# Patient Record
Sex: Male | Born: 1960 | Race: White | Hispanic: No | Marital: Married | Smoking: Never smoker
Health system: Southern US, Community
[De-identification: ages and names within clinical notes are randomized; demographics above are authoritative.]

## PROBLEM LIST (undated history)

## (undated) DIAGNOSIS — E119 Type 2 diabetes mellitus without complications: Secondary | ICD-10-CM

## (undated) DIAGNOSIS — I10 Essential (primary) hypertension: Secondary | ICD-10-CM

## (undated) HISTORY — PX: APPENDECTOMY: SHX54

## (undated) HISTORY — PX: OTHER SURGICAL HISTORY: SHX169

---

## 2002-07-15 HISTORY — PX: HERNIA REPAIR: SHX51

## 2003-06-14 ENCOUNTER — Other Ambulatory Visit: Payer: Self-pay

## 2004-10-14 ENCOUNTER — Emergency Department: Payer: Self-pay | Admitting: General Practice

## 2007-03-22 ENCOUNTER — Emergency Department: Payer: Self-pay

## 2007-04-23 ENCOUNTER — Ambulatory Visit: Payer: Self-pay | Admitting: Internal Medicine

## 2007-05-05 ENCOUNTER — Ambulatory Visit: Payer: Self-pay | Admitting: Urology

## 2008-05-23 ENCOUNTER — Ambulatory Visit: Payer: Self-pay | Admitting: Urology

## 2008-08-28 ENCOUNTER — Ambulatory Visit: Payer: Self-pay | Admitting: Internal Medicine

## 2010-09-21 ENCOUNTER — Ambulatory Visit: Payer: Self-pay | Admitting: Internal Medicine

## 2013-03-26 ENCOUNTER — Emergency Department: Payer: Self-pay | Admitting: Emergency Medicine

## 2013-04-03 ENCOUNTER — Emergency Department: Payer: Self-pay | Admitting: Emergency Medicine

## 2014-09-02 DIAGNOSIS — I10 Essential (primary) hypertension: Secondary | ICD-10-CM | POA: Insufficient documentation

## 2014-09-02 DIAGNOSIS — E782 Mixed hyperlipidemia: Secondary | ICD-10-CM | POA: Insufficient documentation

## 2016-10-31 DIAGNOSIS — E119 Type 2 diabetes mellitus without complications: Secondary | ICD-10-CM | POA: Insufficient documentation

## 2016-10-31 DIAGNOSIS — Z794 Long term (current) use of insulin: Secondary | ICD-10-CM

## 2016-11-20 ENCOUNTER — Other Ambulatory Visit
Admission: RE | Admit: 2016-11-20 | Discharge: 2016-11-20 | Disposition: A | Discharge status: Home / Self Care | Payer: 59 | Source: Ambulatory Visit | Attending: Family Medicine | Admitting: Family Medicine

## 2016-11-20 DIAGNOSIS — M25561 Pain in right knee: Secondary | ICD-10-CM | POA: Insufficient documentation

## 2016-11-24 LAB — BODY FLUID CULTURE: CULTURE: NO GROWTH

## 2016-11-25 DIAGNOSIS — M1A00X0 Idiopathic chronic gout, unspecified site, without tophus (tophi): Secondary | ICD-10-CM | POA: Insufficient documentation

## 2016-12-02 ENCOUNTER — Emergency Department: Payer: 59

## 2016-12-02 ENCOUNTER — Encounter: Payer: Self-pay | Admitting: Emergency Medicine

## 2016-12-02 ENCOUNTER — Inpatient Hospital Stay
Admission: EM | Admit: 2016-12-02 | Discharge: 2016-12-04 | DRG: 683 | Disposition: A | Discharge status: Home / Self Care | Payer: 59 | Attending: Internal Medicine | Admitting: Internal Medicine

## 2016-12-02 DIAGNOSIS — Z794 Long term (current) use of insulin: Secondary | ICD-10-CM

## 2016-12-02 DIAGNOSIS — M7121 Synovial cyst of popliteal space [Baker], right knee: Secondary | ICD-10-CM | POA: Diagnosis present

## 2016-12-02 DIAGNOSIS — D72829 Elevated white blood cell count, unspecified: Secondary | ICD-10-CM | POA: Diagnosis present

## 2016-12-02 DIAGNOSIS — Z85841 Personal history of malignant neoplasm of brain: Secondary | ICD-10-CM | POA: Diagnosis not present

## 2016-12-02 DIAGNOSIS — E785 Hyperlipidemia, unspecified: Secondary | ICD-10-CM | POA: Diagnosis present

## 2016-12-02 DIAGNOSIS — N170 Acute kidney failure with tubular necrosis: Principal | ICD-10-CM | POA: Diagnosis present

## 2016-12-02 DIAGNOSIS — E871 Hypo-osmolality and hyponatremia: Secondary | ICD-10-CM | POA: Diagnosis present

## 2016-12-02 DIAGNOSIS — N179 Acute kidney failure, unspecified: Secondary | ICD-10-CM | POA: Diagnosis present

## 2016-12-02 DIAGNOSIS — M25561 Pain in right knee: Secondary | ICD-10-CM

## 2016-12-02 DIAGNOSIS — M25562 Pain in left knee: Secondary | ICD-10-CM | POA: Diagnosis present

## 2016-12-02 DIAGNOSIS — M109 Gout, unspecified: Secondary | ICD-10-CM | POA: Diagnosis present

## 2016-12-02 DIAGNOSIS — M79605 Pain in left leg: Secondary | ICD-10-CM

## 2016-12-02 DIAGNOSIS — E119 Type 2 diabetes mellitus without complications: Secondary | ICD-10-CM | POA: Diagnosis present

## 2016-12-02 DIAGNOSIS — E860 Dehydration: Secondary | ICD-10-CM | POA: Diagnosis present

## 2016-12-02 DIAGNOSIS — M79604 Pain in right leg: Secondary | ICD-10-CM

## 2016-12-02 DIAGNOSIS — R0602 Shortness of breath: Secondary | ICD-10-CM

## 2016-12-02 DIAGNOSIS — Z79899 Other long term (current) drug therapy: Secondary | ICD-10-CM

## 2016-12-02 DIAGNOSIS — I10 Essential (primary) hypertension: Secondary | ICD-10-CM | POA: Diagnosis present

## 2016-12-02 HISTORY — DX: Essential (primary) hypertension: I10

## 2016-12-02 HISTORY — DX: Type 2 diabetes mellitus without complications: E11.9

## 2016-12-02 LAB — URINALYSIS, COMPLETE (UACMP) WITH MICROSCOPIC
BILIRUBIN URINE: NEGATIVE
Bacteria, UA: NONE SEEN
Glucose, UA: NEGATIVE mg/dL
Hgb urine dipstick: NEGATIVE
Ketones, ur: NEGATIVE mg/dL
Leukocytes, UA: NEGATIVE
Nitrite: NEGATIVE
PH: 5 (ref 5.0–8.0)
Protein, ur: NEGATIVE mg/dL
SPECIFIC GRAVITY, URINE: 1.015 (ref 1.005–1.030)

## 2016-12-02 LAB — COMPREHENSIVE METABOLIC PANEL
ALT: 14 U/L — ABNORMAL LOW (ref 17–63)
AST: 18 U/L (ref 15–41)
Albumin: 3.7 g/dL (ref 3.5–5.0)
Alkaline Phosphatase: 69 U/L (ref 38–126)
Anion gap: 13 (ref 5–15)
BILIRUBIN TOTAL: 0.8 mg/dL (ref 0.3–1.2)
BUN: 56 mg/dL — AB (ref 6–20)
CO2: 16 mmol/L — ABNORMAL LOW (ref 22–32)
Calcium: 8.8 mg/dL — ABNORMAL LOW (ref 8.9–10.3)
Chloride: 101 mmol/L (ref 101–111)
Creatinine, Ser: 2.68 mg/dL — ABNORMAL HIGH (ref 0.61–1.24)
GFR, EST AFRICAN AMERICAN: 29 mL/min — AB (ref >60)
GFR, EST NON AFRICAN AMERICAN: 25 mL/min — AB (ref >60)
Glucose, Bld: 264 mg/dL — ABNORMAL HIGH (ref 65–99)
POTASSIUM: 4.7 mmol/L (ref 3.5–5.1)
Sodium: 130 mmol/L — ABNORMAL LOW (ref 135–145)
TOTAL PROTEIN: 7.4 g/dL (ref 6.5–8.1)

## 2016-12-02 LAB — CBC
HCT: 37.9 % — ABNORMAL LOW (ref 40.0–52.0)
Hemoglobin: 12.7 g/dL — ABNORMAL LOW (ref 13.0–18.0)
MCH: 27.5 pg (ref 26.0–34.0)
MCHC: 33.5 g/dL (ref 32.0–36.0)
MCV: 82 fL (ref 80.0–100.0)
PLATELETS: 341 10*3/uL (ref 150–440)
RBC: 4.62 MIL/uL (ref 4.40–5.90)
RDW: 14.6 % — AB (ref 11.5–14.5)
WBC: 16.2 10*3/uL — ABNORMAL HIGH (ref 3.8–10.6)

## 2016-12-02 LAB — TROPONIN I: Troponin I: <0.03 ng/mL (ref <0.03)

## 2016-12-02 LAB — GLUCOSE, CAPILLARY: GLUCOSE-CAPILLARY: 213 mg/dL — AB (ref 65–99)

## 2016-12-02 LAB — BRAIN NATRIURETIC PEPTIDE: B Natriuretic Peptide: 12 pg/mL (ref 0.0–100.0)

## 2016-12-02 MED ORDER — ONDANSETRON HCL 4 MG/2ML IJ SOLN: 4.0000 mg | Freq: Four times a day (QID) | INTRAMUSCULAR | Status: DC | PRN

## 2016-12-02 MED ORDER — SODIUM CHLORIDE 0.9 % IV SOLN
INTRAVENOUS | Status: DC
  Administered 2016-12-02 – 2016-12-04 (×4): via INTRAVENOUS

## 2016-12-02 MED ORDER — SODIUM CHLORIDE 0.9 % IV BOLUS (SEPSIS)
1000.0000 mL | Freq: Once | INTRAVENOUS | Status: AC
  Administered 2016-12-02: 1000 mL via INTRAVENOUS

## 2016-12-02 MED ORDER — INSULIN ASPART 100 UNIT/ML ~~LOC~~ SOLN
0.0000 [IU] | Freq: Three times a day (TID) | SUBCUTANEOUS | Status: DC
  Administered 2016-12-03 – 2016-12-04 (×2): 1 [IU] via SUBCUTANEOUS
  Filled 2016-12-02 (×2): qty 1

## 2016-12-02 MED ORDER — CARVEDILOL 6.25 MG PO TABS
6.2500 mg | ORAL_TABLET | Freq: Two times a day (BID) | ORAL | Status: DC
  Administered 2016-12-02 – 2016-12-04 (×4): 6.25 mg via ORAL
  Filled 2016-12-02 (×4): qty 1

## 2016-12-02 MED ORDER — ROSUVASTATIN CALCIUM 10 MG PO TABS
20.0000 mg | ORAL_TABLET | Freq: Every day | ORAL | Status: DC
  Administered 2016-12-02 – 2016-12-04 (×3): 20 mg via ORAL
  Filled 2016-12-02 (×3): qty 2

## 2016-12-02 MED ORDER — INSULIN GLARGINE 100 UNIT/ML ~~LOC~~ SOLN
40.0000 [IU] | Freq: Every day | SUBCUTANEOUS | Status: DC
  Administered 2016-12-02: 40 [IU] via SUBCUTANEOUS
  Filled 2016-12-02 (×3): qty 0.4

## 2016-12-02 MED ORDER — ACETAMINOPHEN 325 MG PO TABS
650.0000 mg | ORAL_TABLET | Freq: Four times a day (QID) | ORAL | Status: DC | PRN
  Administered 2016-12-04: 650 mg via ORAL
  Filled 2016-12-02: qty 2

## 2016-12-02 MED ORDER — GLIMEPIRIDE 4 MG PO TABS
4.0000 mg | ORAL_TABLET | Freq: Every day | ORAL | Status: DC
  Administered 2016-12-03: 4 mg via ORAL
  Filled 2016-12-02: qty 1

## 2016-12-02 MED ORDER — HEPARIN SODIUM (PORCINE) 5000 UNIT/ML IJ SOLN
5000.0000 [IU] | Freq: Three times a day (TID) | INTRAMUSCULAR | Status: DC
  Administered 2016-12-02 – 2016-12-04 (×6): 5000 [IU] via SUBCUTANEOUS
  Filled 2016-12-02 (×6): qty 1

## 2016-12-02 MED ORDER — ONDANSETRON HCL 4 MG PO TABS: 4.0000 mg | ORAL_TABLET | Freq: Four times a day (QID) | ORAL | Status: DC | PRN

## 2016-12-02 MED ORDER — ACETAMINOPHEN 650 MG RE SUPP: 650.0000 mg | Freq: Four times a day (QID) | RECTAL | Status: DC | PRN

## 2016-12-02 MED ORDER — TRAMADOL HCL 50 MG PO TABS
100.0000 mg | ORAL_TABLET | Freq: Four times a day (QID) | ORAL | Status: DC | PRN
  Administered 2016-12-03 – 2016-12-04 (×3): 100 mg via ORAL
  Filled 2016-12-02 (×3): qty 2

## 2016-12-02 NOTE — ED Notes (Signed)
Patient presents to the ED stating, "I think I'm dehydrated."  Patient is complaining of shortness of breath on exertion and feeling tired and light headed.  Patient states he has recently been on antibiotics and prednisone for bilateral knee pain.  Patient states the knee pain is much better since starting the antibiotics.  Patient's wife states after patient was out of antibiotics for 3 days, the knee pain returned somewhat.  Patient states he feels better after drinking Gatorade or eating watermelon.

## 2016-12-02 NOTE — H&P (Signed)
[place] [facility] [place] NAME: Randy Luna    MR#:  [id number]  DATE OF BIRTH:  20-Jun-1961  DATE OF ADMISSION:  12/02/2016  PRIMARY CARE PHYSICIAN: Katheren Shams   REQUESTING/REFERRING PHYSICIAN: McShane MD  CHIEF COMPLAINT:   Chief Complaint  Patient presents with  . Shortness of Breath    HISTORY OF PRESENT ILLNESS: Randy Luna  is a 56 y.o. male with a known history of Diabetes type 2, essential hypertension who is presenting to the emergency room with complaint of shortness of breath and feeling weak. Patient in the emergency room and noted to have acute renal failure. Patient reports that his shortness of breath is usually with activity. Patient denies any orthopnea or nocturnal dyspnea and denies any swelling of his lower extremity. Denies any chest pains or palpitations. He does take Advil 2-3 times a week he takes 2-3 tablets as needed. But nothing in excess. He otherwise denies any nausea vomiting or diarrhea. PAST MEDICAL HISTORY:   Past Medical History:  Diagnosis Date  . Diabetes mellitus without complication ([place])   . Hypertension     PAST SURGICAL HISTORY:  Past Surgical History:  Procedure Laterality Date  . APPENDECTOMY    . brain tumor removed    . finger amputation repair      SOCIAL HISTORY:  Social History  Substance Use Topics  . Smoking status: Never Smoker  . Smokeless tobacco: Never Used  . Alcohol use No    FAMILY HISTORY:  Family History  Problem Relation Age of Onset  . Deep vein thrombosis Father     DRUG ALLERGIES: No Known Allergies  REVIEW OF SYSTEMS:   CONSTITUTIONAL: No fever, Positive fatigue and weakness.  EYES: No blurred or double vision.  EARS, NOSE, AND THROAT: No tinnitus or ear pain.  RESPIRATORY: No cough, shortness of breath, wheezing or hemoptysis.  CARDIOVASCULAR: No chest pain, orthopnea, edema.  GASTROINTESTINAL: No nausea, vomiting, diarrhea or abdominal pain.   GENITOURINARY: No dysuria, hematuria.  ENDOCRINE: No polyuria, nocturia,  HEMATOLOGY: No anemia, easy bruising or bleeding SKIN: No rash or lesion. MUSCULOSKELETAL: No joint pain or arthritis.   NEUROLOGIC: No tingling, numbness, weakness.  PSYCHIATRY: No anxiety or depression.   MEDICATIONS AT HOME:  Prior to Admission medications   Medication Sig Start Date End Date Taking? Authorizing Provider  allopurinol (ZYLOPRIM) 100 MG tablet Take 3 tablets by mouth daily. 11/25/16  Yes [provider]  carvedilol (COREG) 6.25 MG tablet Take 1 tablet by mouth 2 (two) times daily. 11/21/16  Yes [provider]  doxycycline (VIBRAMYCIN) 100 MG capsule Take 1 capsule by mouth daily. 11/21/16  Yes [provider]  glimepiride (AMARYL) 4 MG tablet Take 1 tablet by mouth daily. 11/21/16  Yes [provider]  LANTUS SOLOSTAR 100 UNIT/ML Solostar Pen Inject 40 Units into the skin daily. 11/21/16  Yes [provider]  lisinopril-hydrochlorothiazide (PRINZIDE,ZESTORETIC) 20-12.5 MG tablet Take 1 tablet by mouth daily. 11/21/16  Yes [provider]  metFORMIN (GLUCOPHAGE) 1000 MG tablet Take 1 tablet by mouth 2 (two) times daily. 11/21/16  Yes [provider]  rosuvastatin (CRESTOR) 20 MG tablet Take 1 tablet by mouth daily. 11/21/16  Yes [provider]  traMADol (ULTRAM) 50 MG tablet Take 2 tablets by mouth every 6 (six) hours as needed. 11/21/16 12/11/16 Yes [provider]      PHYSICAL EXAMINATION:   VITAL SIGNS: Blood pressure 126/85, pulse 87, temperature 98.3 F (36.8  C), temperature source Oral, resp. rate 18, height 5\' 6"  (1.676 m), weight 235 lb (106.6 kg), SpO2 100 %.  GENERAL:  56 y.o.-year-old patient lying in the bed with no acute distress.  EYES: Pupils equal, round, reactive to light and accommodation. No scleral icterus. Extraocular muscles intact.  HEENT: Head atraumatic, normocephalic. Oropharynx and nasopharynx  clear.  NECK:  Supple, no jugular venous distention. No thyroid enlargement, no tenderness.  LUNGS: Normal breath sounds bilaterally, no wheezing, rales,rhonchi or crepitation. No use of accessory muscles of respiration.  CARDIOVASCULAR: S1, S2 normal. No murmurs, rubs, or gallops.  ABDOMEN: Soft, nontender, nondistended. Bowel sounds present. No organomegaly or mass.  EXTREMITIES: No pedal edema, cyanosis, or clubbing.  NEUROLOGIC: Cranial nerves II through XII are intact. Muscle strength 5/5 in all extremities. Sensation intact. Gait not checked.  PSYCHIATRIC: The patient is alert and oriented x 3.  SKIN: No obvious rash, lesion, or ulcer.   LABORATORY PANEL:   CBC  Recent Labs Lab 12/02/16 1427  WBC 16.2*  HGB 12.7*  HCT 37.9*  PLT 341  MCV 82.0  MCH 27.5  MCHC 33.5  RDW 14.6*   ------------------------------------------------------------------------------------------------------------------  Chemistries   Recent Labs Lab 12/02/16 1427  NA 130*  K 4.7  CL 101  CO2 16*  GLUCOSE 264*  BUN 56*  CREATININE 2.68*  CALCIUM 8.8*  AST 18  ALT 14*  ALKPHOS 69  BILITOT 0.8   ------------------------------------------------------------------------------------------------------------------ estimated creatinine clearance is 35.6 mL/min (A) (by C-G formula based on SCr of 2.68 mg/dL (H)). ------------------------------------------------------------------------------------------------------------------ No results for input(s): TSH, T4TOTAL, T3FREE, THYROIDAB in the last 72 hours.  Invalid input(s): FREET3   Coagulation profile No results for input(s): INR, PROTIME in the last 168 hours. ------------------------------------------------------------------------------------------------------------------- No results for input(s): DDIMER in the last 72  hours. -------------------------------------------------------------------------------------------------------------------  Cardiac Enzymes  Recent Labs Lab 12/02/16 1427  TROPONINI <0.03   ------------------------------------------------------------------------------------------------------------------ Invalid input(s): POCBNP  ---------------------------------------------------------------------------------------------------------------  Urinalysis No results found for: COLORURINE, APPEARANCEUR, LABSPEC, PHURINE, GLUCOSEU, HGBUR, BILIRUBINUR, KETONESUR, PROTEINUR, UROBILINOGEN, NITRITE, LEUKOCYTESUR   RADIOLOGY: Dg Chest 2 View  Result Date: 12/02/2016 CLINICAL DATA:  Fatigue in shortness of breath EXAM: CHEST  2 VIEW COMPARISON:  None. FINDINGS: The heart size and mediastinal contours are within normal limits. Both lungs are clear. The visualized skeletal structures are unremarkable. IMPRESSION: No active cardiopulmonary disease. Electronically Signed   By: Donavan Foil M.D.   On: 12/02/2016 15:11    EKG: Orders placed or performed during the hospital encounter of 12/02/16  . EKG 12-Lead  . EKG 12-Lead  . ED EKG within 10 minutes  . ED EKG within 10 minutes    IMPRESSION AND PLAN: Patient is a 56 year old presenting to the emergency room with shortness of breath  1. Acute renal failure Etiology unclear his renal function was normal about a month ago. We'll give IV fluids monitor renal function will order renal ultrasound Obtain nephrology consult Follow-up on urinalysis  2. Shortness of breath Etiology again unclear Will obtain ultrasound of his lower extremity  VQ scan tomorrow morning I will also order echocardiogram of his heart  3. Leukocytosis Urinalysis currently pending  4. Diabetes type 2 Will place him on sliding scale insulin continue his Lantus Check a hemoglobin A1c  5. Hyperlipidemia unspecified continue therapy with Crestor  6. Miscellaneous  heparin for DVT prophylaxis     All the records are reviewed and case discussed with ED provider. Management plans discussed with the patient, family and they are in agreement.  CODE STATUS: Code Status History    This patient does not have a recorded code status. Please follow your organizational policy for patients in this situation.       TOTAL TIME TAKING CARE OF THIS PATIENT: 55 minutes.    Randy Luna M.D on 12/02/2016 at 6:10 PM  Between 7am to 6pm - Pager - [phone]  After 6pm go to [email or web address] - password [email or web address] [facility]  [facility]  Office  [phone]  CC: Primary care physician; Katheren Shams

## 2016-12-02 NOTE — ED Provider Notes (Signed)
[name] Emergency Department Provider Note  ____________________________________________   I have reviewed the triage vital signs and the nursing notes.   HISTORY  Chief Complaint Shortness of Breath    HPI Randy Luna is a 56 y.o. male with a history of gout, remote history of finger amputation repair, remote history of brain tumor resection, presents today complaining of shortness of breath.Patient states the last 4 days she's had no energy and so dyspneic when he walks around he has to stop. He has never had this before. He feels dehydrated. He did take some fluid and felt a little bit better. He denies any nausea vomiting or diarrhea. He has no chest pain or swelling of his legs. No recent travel besides [facility]. He states that he has had no recent airplane travel. He has a family history of DVT in his father. He did have leg swelling he states several weeks ago which she associated with his gout he took antibiotics and get medication and it went away. He recently went to a gout doctor and was given prescription for multiple medications which she has not yet taken. The patient does take nonsteroidals 3 times a day. Advil. He has had no headache no vomiting. He feels non-symptomatic as he lies still but when he walks he gets very tired and winded. He has had no dysuria or urinary frequency, he does not know of any other complaints that he can the list. He is not had any other new medications. Patient's creatinine according to labs that he brought with him was 1.4 about one month ago.     Past Medical History:  Diagnosis Date  . Diabetes mellitus without complication ([place])   . Hypertension     There are no active problems to display for this patient.   Past Surgical History:  Procedure Laterality Date  . APPENDECTOMY    . brain tumor removed    . finger amputation repair      Prior to Admission medications   Not on File     Allergies Patient has no known allergies.  No family history on file.  Social History Social History  Substance Use Topics  . Smoking status: Never Smoker  . Smokeless tobacco: Not on file  . Alcohol use Not on file    Review of Systems Constitutional: No fever/chills Eyes: No visual changes. ENT: No sore throat. No stiff neck no neck pain Cardiovascular: Denies chest pain. Respiratory: Positive shortness of breath. Gastrointestinal:   no vomiting.  No diarrhea.  No constipation. Genitourinary: Negative for dysuria. Musculoskeletal: Negative lower extremity swelling Skin: Negative for rash. Neurological: Negative for severe headaches, focal weakness or numbness. 10-point ROS otherwise negative.  ____________________________________________   PHYSICAL EXAM:  VITAL SIGNS: ED Triage Vitals  Enc Vitals Group     BP 12/02/16 1422 113/64     Pulse Rate 12/02/16 1422 (!) 103     Resp 12/02/16 1422 16     Temp 12/02/16 1422 98.3 F (36.8 C)     Temp Source 12/02/16 1422 Oral     SpO2 12/02/16 1422 98 %     Weight 12/02/16 1422 235 lb (106.6 kg)     Height 12/02/16 1422 5\' 6"  (1.676 m)     Head Circumference --      Peak Flow --      Pain Score 12/02/16 1428 0     Pain Loc --      Pain Edu? --  Excl. in [place]? --     Constitutional: Alert and oriented. Well appearing and in no acute distress. Eyes: Conjunctivae are normal. PERRL. EOMI. Head: Atraumatic. Nose: No congestion/rhinnorhea. Mouth/Throat: Mucous membranes are moist.  Oropharynx non-erythematous. Neck: No stridor.   Nontender with no meningismus Cardiovascular: Normal rate, regular rhythm. Grossly normal heart sounds.  Good peripheral circulation. Respiratory: Normal respiratory effort.  No retractions. Lungs CTAB. Abdominal: Soft and nontender. No distention. No guarding no rebound Back:  There is no focal tenderness or step off.  there is no midline tenderness there are no lesions noted. there is  no CVA tenderness Musculoskeletal: No lower extremity tenderness, no upper extremity tenderness. No joint effusions, no DVT signs strong distal pulses no edema Neurologic:  Normal speech and language. No gross focal neurologic deficits are appreciated.  Skin:  Skin is warm, dry and intact. No rash noted. Psychiatric: Mood and affect are normal. Speech and behavior are normal.  ____________________________________________   LABS (all labs ordered are listed, but only abnormal results are displayed)  Labs Reviewed  CBC - Abnormal; Notable for the following:       Result Value   WBC 16.2 (*)    Hemoglobin 12.7 (*)    HCT 37.9 (*)    RDW 14.6 (*)    All other components within normal limits  COMPREHENSIVE METABOLIC PANEL - Abnormal; Notable for the following:    Sodium 130 (*)    CO2 16 (*)    Glucose, Bld 264 (*)    BUN 56 (*)    Creatinine, Ser 2.68 (*)    Calcium 8.8 (*)    ALT 14 (*)    GFR calc non Af Amer 25 (*)    GFR calc Af Amer 29 (*)    All other components within normal limits  TROPONIN I   ____________________________________________  EKG  I personally interpreted any EKGs ordered by me or triage Normal sinus rhythm at 90 bpm no acute ST elevation or depression, no acute or ischemic ST changes normal axis unremarkable EKG ____________________________________________  RADIOLOGY  I reviewed any imaging ordered by me or triage that were performed during my shift and, if possible, patient and/or family made aware of any abnormal findings. ____________________________________________   PROCEDURES  Procedure(s) performed: None  Procedures  Critical Care performed: None  ____________________________________________   INITIAL IMPRESSION / ASSESSMENT AND PLAN / ED COURSE  Pertinent labs & imaging results that were available during my care of the patient were reviewed by me and considered in my medical decision making (see chart for details).  Patient here  with dyspnea on exertion feeling of being dehydrated. His creatinine is 2.7. This is up from his baseline of 1.4. Could be dehydration although that be somewhat unusual. It wouldn't I don't think fully account for his shortness of breath. I have ordered bilateral Dopplers, have low suspicion for PE in this patient but I cannot do a CT scan given his renal function. We will begin to hydrate him here. Given his dyspnea, and acute renal injury think he would benefit from further observational admission I will discuss with hospitalist.    ____________________________________________   FINAL CLINICAL IMPRESSION(S) / ED DIAGNOSES  Final diagnoses:  Bilateral leg pain      This chart was dictated using voice recognition software.  Despite best efforts to proofread,  errors can occur which can change meaning.      Schuyler Amor, MD 12/02/16 1730

## 2016-12-02 NOTE — ED Triage Notes (Signed)
States general fatigue and SOB x 3 days. Denies fevers. Denies chest pain.

## 2016-12-03 ENCOUNTER — Inpatient Hospital Stay: Payer: 59

## 2016-12-03 ENCOUNTER — Encounter: Payer: Self-pay | Admitting: Radiology

## 2016-12-03 ENCOUNTER — Inpatient Hospital Stay
Admit: 2016-12-03 | Discharge: 2016-12-03 | Disposition: A | Discharge status: Home / Self Care | Payer: 59 | Attending: Internal Medicine | Admitting: Internal Medicine

## 2016-12-03 LAB — BASIC METABOLIC PANEL
ANION GAP: 5 (ref 5–15)
BUN: 48 mg/dL — ABNORMAL HIGH (ref 6–20)
CHLORIDE: 109 mmol/L (ref 101–111)
CO2: 22 mmol/L (ref 22–32)
CREATININE: 1.66 mg/dL — AB (ref 0.61–1.24)
Calcium: 8.3 mg/dL — ABNORMAL LOW (ref 8.9–10.3)
GFR calc Af Amer: 52 mL/min — ABNORMAL LOW (ref >60)
GFR calc non Af Amer: 45 mL/min — ABNORMAL LOW (ref >60)
Glucose, Bld: 125 mg/dL — ABNORMAL HIGH (ref 65–99)
POTASSIUM: 4.3 mmol/L (ref 3.5–5.1)
Sodium: 136 mmol/L (ref 135–145)

## 2016-12-03 LAB — CBC
HEMATOCRIT: 33.8 % — AB (ref 40.0–52.0)
HEMOGLOBIN: 11.1 g/dL — AB (ref 13.0–18.0)
MCH: 27.1 pg (ref 26.0–34.0)
MCHC: 32.7 g/dL (ref 32.0–36.0)
MCV: 82.8 fL (ref 80.0–100.0)
Platelets: 296 10*3/uL (ref 150–440)
RBC: 4.08 MIL/uL — AB (ref 4.40–5.90)
RDW: 14.9 % — ABNORMAL HIGH (ref 11.5–14.5)
WBC: 12 10*3/uL — ABNORMAL HIGH (ref 3.8–10.6)

## 2016-12-03 LAB — URIC ACID: URIC ACID, SERUM: 9.8 mg/dL — AB (ref 4.4–7.6)

## 2016-12-03 LAB — C-REACTIVE PROTEIN: CRP: 5.4 mg/dL — AB (ref <1.0)

## 2016-12-03 LAB — GLUCOSE, CAPILLARY
GLUCOSE-CAPILLARY: 101 mg/dL — AB (ref 65–99)
Glucose-Capillary: 121 mg/dL — ABNORMAL HIGH (ref 65–99)
Glucose-Capillary: 78 mg/dL (ref 65–99)
Glucose-Capillary: 132 mg/dL — ABNORMAL HIGH (ref 65–99)

## 2016-12-03 LAB — SEDIMENTATION RATE: Sed Rate: 53 mm/hr — ABNORMAL HIGH (ref 0–20)

## 2016-12-03 MED ORDER — INSULIN GLARGINE 100 UNIT/ML ~~LOC~~ SOLN
30.0000 [IU] | Freq: Every day | SUBCUTANEOUS | Status: DC
  Administered 2016-12-03: 30 [IU] via SUBCUTANEOUS
  Filled 2016-12-03 (×2): qty 0.3

## 2016-12-03 MED ORDER — TECHNETIUM TC 99M DIETHYLENETRIAME-PENTAACETIC ACID
32.7930 | Freq: Once | INTRAVENOUS | Status: AC | PRN
  Administered 2016-12-03: 32.793 via INTRAVENOUS

## 2016-12-03 MED ORDER — TECHNETIUM TO 99M ALBUMIN AGGREGATED
4.4240 | Freq: Once | INTRAVENOUS | Status: AC | PRN
  Administered 2016-12-03: 4.424 via INTRAVENOUS

## 2016-12-03 NOTE — Consult Note (Signed)
[facility]  CONSULT NOTE    Date: 12/03/2016                  Patient Name:  Randy Luna  MRN: [id number]  DOB: 1961-03-28  Age / Sex: 56 y.o., male         PCP: Katheren Shams                 Service Requesting Consult: Dr. Posey Pronto                 Reason for Consult: Acute renal failure            History of Present Illness: Mr. Randy Luna is a 56 y.o. white male with diabetes mellitus type II, hypertension, gout, hyperlipidemia, history of intracranial tumor, appendectomy, who was admitted to [facility] on 12/02/2016 for SOB (shortness of breath) [R06.02] Bilateral leg pain [M79.604, M79.605] Acute renal failure (ARF) ([place]) [N17.9] AKI (acute kidney injury) ([place]) [N17.9]  Patient went to the beach last week and says he has felt weak since then. He feels better after getting IV fluids. Creatinine on admission of 2.68 and improved to 1.66 today.   Main complaint is knee pain. New diagnosis of right baker's cyst.    Medications: Outpatient medications: Prescriptions Prior to Admission  Medication Sig Dispense Refill Last Dose  . allopurinol (ZYLOPRIM) 100 MG tablet Take 3 tablets by mouth daily.     . carvedilol (COREG) 6.25 MG tablet Take 1 tablet by mouth 2 (two) times daily.   12/02/2016 at 0700  . doxycycline (VIBRAMYCIN) 100 MG capsule Take 1 capsule by mouth daily.   12/02/2016 at 0800  . glimepiride (AMARYL) 4 MG tablet Take 1 tablet by mouth daily.   12/02/2016 at 0700  . LANTUS SOLOSTAR 100 UNIT/ML Solostar Pen Inject 40 Units into the skin daily.   12/01/2016 at Unknown time  . lisinopril-hydrochlorothiazide (PRINZIDE,ZESTORETIC) 20-12.5 MG tablet Take 1 tablet by mouth daily.     . metFORMIN (GLUCOPHAGE) 1000 MG tablet Take 1 tablet by mouth 2 (two) times daily.   12/02/2016 at 0700  . rosuvastatin (CRESTOR) 20 MG tablet Take 1 tablet by mouth daily.   12/01/2016 at Unknown time  . traMADol (ULTRAM) 50 MG tablet Take 2 tablets by mouth every  6 (six) hours as needed.   prn at prn    Current medications: Current Facility-Administered Medications  Medication Dose Route Frequency Provider Last Rate Last Dose  . 0.9 %  sodium chloride infusion   Intravenous Continuous Dustin Flock, MD 100 mL/hr at 12/03/16 0451    . acetaminophen (TYLENOL) tablet 650 mg  650 mg Oral Q6H PRN Dustin Flock, MD       Or  . acetaminophen (TYLENOL) suppository 650 mg  650 mg Rectal Q6H PRN Dustin Flock, MD      . carvedilol (COREG) tablet 6.25 mg  6.25 mg Oral BID Dustin Flock, MD   6.25 mg at 12/03/16 1130  . glimepiride (AMARYL) tablet 4 mg  4 mg Oral Daily Dustin Flock, MD   4 mg at 12/03/16 1130  . heparin injection 5,000 Units  5,000 Units Subcutaneous Q8H Dustin Flock, MD   5,000 Units at 12/03/16 0603  . insulin aspart (novoLOG) injection 0-9 Units  0-9 Units Subcutaneous TID WC Dustin Flock, MD   1 Units at 12/03/16 1243  . insulin glargine (LANTUS) injection 40 Units  40 Units Subcutaneous Daily Dustin Flock, MD   40 Units at  12/02/16 2153  . ondansetron (ZOFRAN) tablet 4 mg  4 mg Oral Q6H PRN Dustin Flock, MD       Or  . ondansetron [facility]) injection 4 mg  4 mg Intravenous Q6H PRN Dustin Flock, MD      . rosuvastatin (CRESTOR) tablet 20 mg  20 mg Oral Daily Dustin Flock, MD   20 mg at 12/03/16 1129  . traMADol (ULTRAM) tablet 100 mg  100 mg Oral Q6H PRN Dustin Flock, MD          Allergies: No Known Allergies    Past Medical History: Past Medical History:  Diagnosis Date  . Diabetes mellitus without complication ([place])   . Hypertension      Past Surgical History: Past Surgical History:  Procedure Laterality Date  . addenoids    . APPENDECTOMY    . brain tumor removed    . finger amputation repair       Family History: Family History  Problem Relation Age of Onset  . Deep vein thrombosis Father   . Diabetes Father   . Diabetes Mother      Social History: Social History   Social  History  . Marital status: Married    Spouse name: N/A  . Number of children: N/A  . Years of education: N/A   Occupational History  . Not on file.   Social History Main Topics  . Smoking status: Never Smoker  . Smokeless tobacco: Never Used  . Alcohol use No  . Drug use: No  . Sexual activity: No   Other Topics Concern  . Not on file   Social History Narrative  . No narrative on file     Review of Systems: ROS  Vital Signs: Blood pressure 118/75, pulse 79, temperature 97.8 F (36.6 C), temperature source Oral, resp. rate 18, height 5\' 6"  (1.676 m), weight 101.8 kg (224 lb 8 oz), SpO2 100 %.  Weight trends: Filed Weights   12/02/16 1422 12/02/16 1919  Weight: 106.6 kg (235 lb) 101.8 kg (224 lb 8 oz)    Physical Exam: General: NAD,   Head: Normocephalic, atraumatic. Moist oral mucosal membranes  Eyes: Anicteric, PERRL  Neck: Supple, trachea midline  Lungs:  Clear to auscultation  Heart: Regular rate and rhythm  Abdomen:  Soft, nontender,   Extremities: no peripheral edema.  Neurologic: Nonfocal, moving all four extremities  Skin: No lesions        Lab results: Basic Metabolic Panel:  Recent Labs Lab 12/02/16 1427 12/03/16 0405  NA 130* 136  K 4.7 4.3  CL 101 109  CO2 16* 22  GLUCOSE 264* 125*  BUN 56* 48*  CREATININE 2.68* 1.66*  CALCIUM 8.8* 8.3*    Liver Function Tests:  Recent Labs Lab 12/02/16 1427  AST 18  ALT 14*  ALKPHOS 69  BILITOT 0.8  PROT 7.4  ALBUMIN 3.7   No results for input(s): LIPASE, AMYLASE in the last 168 hours. No results for input(s): AMMONIA in the last 168 hours.  CBC:  Recent Labs Lab 12/02/16 1427 12/03/16 0405  WBC 16.2* 12.0*  HGB 12.7* 11.1*  HCT 37.9* 33.8*  MCV 82.0 82.8  PLT 341 296    Cardiac Enzymes:  Recent Labs Lab 12/02/16 1427  TROPONINI <0.03    BNP: Invalid input(s): POCBNP  CBG:  Recent Labs Lab 12/02/16 2109 12/03/16 0752 12/03/16 1213  GLUCAP 213* 101* 121*     Microbiology: Results for orders placed or performed during the hospital encounter  of 11/20/16  Body fluid culture     Status: None   Collection Time: 11/20/16 10:20 AM  Result Value Ref Range Status   Specimen Description KNEE  Final   Special Requests NONE  Final   Gram Stain   Final    ABUNDANT WBC PRESENT,BOTH PMN AND MONONUCLEAR NO ORGANISMS SEEN    Culture   Final    NO GROWTH 3 DAYS Performed at [facility], [address] [place], [place] [address]    Report Status 11/24/2016 FINAL  Final    Coagulation Studies: No results for input(s): LABPROT, INR in the last 72 hours.  Urinalysis:  Recent Labs  12/02/16 1728  COLORURINE YELLOW*  LABSPEC 1.015  PHURINE 5.0  GLUCOSEU NEGATIVE  HGBUR NEGATIVE  BILIRUBINUR NEGATIVE  KETONESUR NEGATIVE  PROTEINUR NEGATIVE  NITRITE NEGATIVE  LEUKOCYTESUR NEGATIVE      Imaging: Dg Chest 2 View  Result Date: 12/02/2016 CLINICAL DATA:  Fatigue in shortness of breath EXAM: CHEST  2 VIEW COMPARISON:  None. FINDINGS: The heart size and mediastinal contours are within normal limits. Both lungs are clear. The visualized skeletal structures are unremarkable. IMPRESSION: No active cardiopulmonary disease. Electronically Signed   By: Donavan Foil M.D.   On: 12/02/2016 15:11   US Renal  Result Date: 12/03/2016 CLINICAL DATA:  Acute renal failure. EXAM: RENAL / URINARY TRACT ULTRASOUND COMPLETE COMPARISON:  None. FINDINGS: Right Kidney: Length: 10.2 cm. Echogenicity within normal limits. No mass or hydronephrosis visualized. Left Kidney: Length: 10.1 cm. Echogenicity within normal limits. No mass or hydronephrosis visualized. Bladder: Appears normal for degree of bladder distention. IMPRESSION: Negative for hydronephrosis.  Normal exam. Electronically Signed   By: Inge Rise M.D.   On: 12/03/2016 11:03   US Venous Img Lower Bilateral  Result Date: 12/03/2016 CLINICAL DATA:  Bilateral lower extremity swelling and pain for  1 month. No known injury. EXAM: BILATERAL LOWER EXTREMITY VENOUS DOPPLER ULTRASOUND TECHNIQUE: Gray-scale sonography with graded compression, as well as color Doppler and duplex ultrasound were performed to evaluate the lower extremity deep venous systems from the level of the common femoral vein and including the common femoral, femoral, profunda femoral, popliteal and calf veins including the posterior tibial, peroneal and gastrocnemius veins when visible. The superficial great saphenous vein was also interrogated. Spectral Doppler was utilized to evaluate flow at rest and with distal augmentation maneuvers in the common femoral, femoral and popliteal veins. COMPARISON:  None. FINDINGS: RIGHT LOWER EXTREMITY Common Femoral Vein: No evidence of thrombus. Normal compressibility, respiratory phasicity and response to augmentation. Saphenofemoral Junction: No evidence of thrombus. Normal compressibility and flow on color Doppler imaging. Profunda Femoral Vein: No evidence of thrombus. Normal compressibility and flow on color Doppler imaging. Femoral Vein: No evidence of thrombus. Normal compressibility, respiratory phasicity and response to augmentation. Popliteal Vein: No evidence of thrombus. Normal compressibility, respiratory phasicity and response to augmentation. Calf Veins: No evidence of thrombus. Normal compressibility and flow on color Doppler imaging. Superficial Great Saphenous Vein: No evidence of thrombus. Normal compressibility and flow on color Doppler imaging. Venous Reflux:  None. Other Findings:  None. LEFT LOWER EXTREMITY Common Femoral Vein: No evidence of thrombus. Normal compressibility, respiratory phasicity and response to augmentation. Saphenofemoral Junction: No evidence of thrombus. Normal compressibility and flow on color Doppler imaging. Profunda Femoral Vein: No evidence of thrombus. Normal compressibility and flow on color Doppler imaging. Femoral Vein: No evidence of thrombus. Normal  compressibility, respiratory phasicity and response to augmentation. Popliteal Vein: No  evidence of thrombus. Normal compressibility, respiratory phasicity and response to augmentation. Calf Veins: No evidence of thrombus. Normal compressibility and flow on color Doppler imaging. Superficial Great Saphenous Vein: No evidence of thrombus. Normal compressibility and flow on color Doppler imaging. Venous Reflux:  None. Other Findings: Cystic lesions with septations and debris are seen in the popliteal fossa on both the right and left consistent with Baker's cysts. Cyst on the right measures 7.0 x 2.6 x 5.8 cm and cyst on the left measures 5.9 x 1.8 x 3.9 cm. IMPRESSION: No evidence of DVT within either lower extremity. Large bilateral Baker's cyst. Electronically Signed   By: Inge Rise M.D.   On: 12/03/2016 11:01      Assessment & Plan: Mr. AARIAN GRIFFIE is a 56 y.o. white male with diabetes mellitus type II, hypertension, gout, hyperlipidemia, history of intracranial tumor, appendectomy, who was admitted to [facility] on 12/02/2016 for SOB (shortness of breath) [R06.02] Bilateral leg pain [M79.604, M79.605] Acute renal failure (ARF) ([place]) [N17.9] AKI (acute kidney injury) ([place]) [N17.9]  1. Acute renal failure: baseline creatinine of 1.14 on 10/2016  Acute renal failure secondary to prerenal azotemia. Improving renal function with IV fluids.  Renal ultrasound reviewed with patient and wife.  - Continue IV NS for one more day.   2. Hypertension: blood pressure at goal.  - continue carvedilol - holding lisinopril and hydrochlorothiazide.   3. Gout: recent acute gout flare. Discussed case with Rheumatology, Dr. Meda Coffee - check uric acid - will need to restart allopurinol.  4. Diabetes mellitus type II with renal manifestations: on metformin.  - continue glucose control.      LOS: [place], Shanta Hartner 5/22/20181:03 PM

## 2016-12-03 NOTE — Progress Notes (Signed)
Initial Nutrition Assessment  DOCUMENTATION CODES:   Obesity unspecified  INTERVENTION:  Encouraged ongoing adequate intake of calories and protein with meals to prevent further unintentional weight loss.  No further nutrition intervention warranted as patient's appetite is now back to baseline and he is finishing 100% of meals.  NUTRITION DIAGNOSIS:   Unintentional weight loss related to poor appetite as evidenced by per patient/family report, 4.5 percent weight loss over 1-2 weeks.  GOAL:   Patient will meet greater than or equal to 90% of their needs  MONITOR:   PO intake, Labs, Weight trends, I & O's  REASON FOR ASSESSMENT:   Malnutrition Screening Tool    ASSESSMENT:   56 year old male with PMHx of DM type 2, HTN, gout who presented with shortness of breath and weakness found to have acute renal failure secondary to prerenal azotemia.   Spoke with patient at bedside. He reports that PTA he had a poor appetite for 1.5-2 weeks. During that time he was eating less than usual. He was still eating 2 meals per day but wasn't able to finish his meals. Was craving fruits such as watermelon or cantaloupe. He denies any N/V, abdominal pain, constipation/diarrhea, or difficulty chewing/swallowing. Reports he is feeling much better now and his appetite is improved to baseline. Reports he is now finishing 100% of meals.  UBW 235 lbs. Patient reports he has lost 10.5 lbs (4.5% body weight) over the past 1-2 weeks, which is significant for time frame.  Medications reviewed and include: Novolog sliding scale TID, Lantus 30 units daily, NS @ 100 ml/hr.  Labs reviewed: CBG 101-213, BUN 48, Creatinine 1.66, Uric Acid 9.8.   Nutrition-Focused physical exam completed. Findings are no fat depletion, no muscle depletion, and no edema.   Patient does not meet criteria for malnutrition.  Diet Order:  Diet heart healthy/carb modified Room service appropriate? Yes; Fluid consistency:  Thin  Skin:  Reviewed, no issues  Last BM:  12/02/2016  Height:   Ht Readings from Last 1 Encounters:  12/02/16 5\' 6"  (1.676 m)    Weight:   Wt Readings from Last 1 Encounters:  12/02/16 224 lb 8 oz (101.8 kg)    Ideal Body Weight:  64.5 kg  BMI:  Body mass index is 36.24 kg/m.  Estimated Nutritional Needs:   Kcal:  1980-2340 (MSJ x 1.1-1.3)  Protein:  100-120 grams (1-1.2 grams/kg)  Fluid:  1.9-2.3 L/day (30-35 ml/kg IBW)  EDUCATION NEEDS:   No education needs identified at this time  Willey Blade, MS, RD, LDN Pager: [phone] After Hours Pager: [phone]

## 2016-12-03 NOTE — Progress Notes (Signed)
Inpatient Diabetes Program Recommendations  AACE/ADA: New Consensus Statement on Inpatient Glycemic Control (2015)  Target Ranges:  Prepandial:   less than 140 mg/dL      Peak postprandial:   less than 180 mg/dL (1-2 hours)      Critically ill patients:  140 - 180 mg/dL   Lab Results  Component Value Date   GLUCAP 101 (H) 12/03/2016    Review of Glycemic Control:  Results for Randy Luna, Randy Luna (MRN [id number]) as of 12/03/2016 11:07  Ref. Range 12/02/2016 21:09 12/03/2016 07:52  Glucose-Capillary Latest Ref Range: 65 - 99 mg/dL 213 (H) 101 (H)   Diabetes history: Type 2 diabetes Outpatient Diabetes medications: Lantus 40 units daily, Amaryl 4 mg daily Current orders for Inpatient glycemic control:  Lantus 40 units q HS, Amaryl 4 mg daily, Novolog sensitive tid with meals  Inpatient Diabetes Program Recommendations:    Consider reducing Lantus to 30 units q HS.  Also may consider d/c of Amaryl while patient is in the hospital.    Thanks, Adah Perl, RN, BC-ADM Inpatient Diabetes Coordinator Pager [phone] (8a-5p)

## 2016-12-03 NOTE — Progress Notes (Signed)
[place] [facility] [place] NAME: Randy Luna    MR#:  [id number]  DATE OF BIRTH:  May 05, 1961  SUBJECTIVE:  CHIEF COMPLAINT:   Chief Complaint  Patient presents with  . Shortness of Breath   feels better. No more shortness of breath. REVIEW OF SYSTEMS:  Review of Systems  Constitutional: Negative for chills, fever and malaise/fatigue.  HENT: Negative for congestion.   Eyes: Negative for blurred vision and double vision.  Respiratory: Negative for cough, shortness of breath, wheezing and stridor.   Cardiovascular: Negative for chest pain, palpitations and leg swelling.  Gastrointestinal: Negative for abdominal pain, blood in stool, diarrhea, melena, nausea and vomiting.  Genitourinary: Negative for dysuria, flank pain, frequency and hematuria.  Musculoskeletal: Negative for back pain.  Skin: Negative for itching and rash.  Neurological: Negative for dizziness, focal weakness, loss of consciousness, weakness and headaches.  Psychiatric/Behavioral: Negative for depression. The patient is not nervous/anxious.     DRUG ALLERGIES:  No Known Allergies VITALS:  Blood pressure (!) 101/59, pulse 84, temperature 97.8 F (36.6 C), temperature source Oral, resp. rate 18, height 5\' 6"  (1.676 m), weight 224 lb 8 oz (101.8 kg), SpO2 99 %. PHYSICAL EXAMINATION:  Physical Exam  Constitutional: He is oriented to person, place, and time and well-developed, well-nourished, and in no distress.  HENT:  Head: Normocephalic.  Mouth/Throat: Oropharynx is clear and moist.  Eyes: Conjunctivae and EOM are normal.  Neck: Normal range of motion. Neck supple. No JVD present. No tracheal deviation present.  Cardiovascular: Normal rate, regular rhythm and normal heart sounds.  Exam reveals no gallop.   No murmur heard. Pulmonary/Chest: Effort normal and breath sounds normal. No respiratory distress. He has no wheezes. He has no rales.  Abdominal: Bowel sounds are  normal. He exhibits no distension. There is no tenderness.  Musculoskeletal: Normal range of motion. He exhibits no edema or tenderness.  Neurological: He is alert and oriented to person, place, and time. No cranial nerve deficit.  Skin: No rash noted. No erythema.  Psychiatric: Affect and judgment normal.   LABORATORY PANEL:  Male CBC  Recent Labs Lab 12/03/16 0405  WBC 12.0*  HGB 11.1*  HCT 33.8*  PLT 296   ------------------------------------------------------------------------------------------------------------------ Chemistries   Recent Labs Lab 12/02/16 1427 12/03/16 0405  NA 130* 136  K 4.7 4.3  CL 101 109  CO2 16* 22  GLUCOSE 264* 125*  BUN 56* 48*  CREATININE 2.68* 1.66*  CALCIUM 8.8* 8.3*  AST 18  --   ALT 14*  --   ALKPHOS 69  --   BILITOT 0.8  --    RADIOLOGY:  Dg Chest 2 View  Result Date: 12/02/2016 CLINICAL DATA:  Fatigue in shortness of breath EXAM: CHEST  2 VIEW COMPARISON:  None. FINDINGS: The heart size and mediastinal contours are within normal limits. Both lungs are clear. The visualized skeletal structures are unremarkable. IMPRESSION: No active cardiopulmonary disease. Electronically Signed   By: Donavan Foil M.D.   On: 12/02/2016 15:11   US Renal  Result Date: 12/03/2016 CLINICAL DATA:  Acute renal failure. EXAM: RENAL / URINARY TRACT ULTRASOUND COMPLETE COMPARISON:  None. FINDINGS: Right Kidney: Length: 10.2 cm. Echogenicity within normal limits. No mass or hydronephrosis visualized. Left Kidney: Length: 10.1 cm. Echogenicity within normal limits. No mass or hydronephrosis visualized. Bladder: Appears normal for degree of bladder distention. IMPRESSION: Negative for hydronephrosis.  Normal exam. Electronically Signed   By: Inge Rise M.D.  On: 12/03/2016 11:03   Nm Pulmonary Perf And Vent  Result Date: 12/03/2016 CLINICAL DATA:  Short of breath EXAM: NUCLEAR MEDICINE VENTILATION - PERFUSION LUNG SCAN TECHNIQUE: Ventilation images were  obtained in multiple projections using inhaled aerosol Tc-66m DTPA. Perfusion images were obtained in multiple projections after intravenous injection of Tc-31m MAA. RADIOPHARMACEUTICALS:  32.8 mCi Technetium-53m DTPA aerosol inhalation and 4.4 mCi Technetium-51m MAA IV COMPARISON:  Chest x-ray 12/02/2016 FINDINGS: Ventilation: No focal ventilation defect. Perfusion: No wedge shaped peripheral perfusion defects to suggest acute pulmonary embolism. IMPRESSION: Normal ventilation and perfusion scan. Electronically Signed   By: Franchot Gallo M.D.   On: 12/03/2016 14:02   US Venous Img Lower Bilateral  Result Date: 12/03/2016 CLINICAL DATA:  Bilateral lower extremity swelling and pain for 1 month. No known injury. EXAM: BILATERAL LOWER EXTREMITY VENOUS DOPPLER ULTRASOUND TECHNIQUE: Gray-scale sonography with graded compression, as well as color Doppler and duplex ultrasound were performed to evaluate the lower extremity deep venous systems from the level of the common femoral vein and including the common femoral, femoral, profunda femoral, popliteal and calf veins including the posterior tibial, peroneal and gastrocnemius veins when visible. The superficial great saphenous vein was also interrogated. Spectral Doppler was utilized to evaluate flow at rest and with distal augmentation maneuvers in the common femoral, femoral and popliteal veins. COMPARISON:  None. FINDINGS: RIGHT LOWER EXTREMITY Common Femoral Vein: No evidence of thrombus. Normal compressibility, respiratory phasicity and response to augmentation. Saphenofemoral Junction: No evidence of thrombus. Normal compressibility and flow on color Doppler imaging. Profunda Femoral Vein: No evidence of thrombus. Normal compressibility and flow on color Doppler imaging. Femoral Vein: No evidence of thrombus. Normal compressibility, respiratory phasicity and response to augmentation. Popliteal Vein: No evidence of thrombus. Normal compressibility, respiratory  phasicity and response to augmentation. Calf Veins: No evidence of thrombus. Normal compressibility and flow on color Doppler imaging. Superficial Great Saphenous Vein: No evidence of thrombus. Normal compressibility and flow on color Doppler imaging. Venous Reflux:  None. Other Findings:  None. LEFT LOWER EXTREMITY Common Femoral Vein: No evidence of thrombus. Normal compressibility, respiratory phasicity and response to augmentation. Saphenofemoral Junction: No evidence of thrombus. Normal compressibility and flow on color Doppler imaging. Profunda Femoral Vein: No evidence of thrombus. Normal compressibility and flow on color Doppler imaging. Femoral Vein: No evidence of thrombus. Normal compressibility, respiratory phasicity and response to augmentation. Popliteal Vein: No evidence of thrombus. Normal compressibility, respiratory phasicity and response to augmentation. Calf Veins: No evidence of thrombus. Normal compressibility and flow on color Doppler imaging. Superficial Great Saphenous Vein: No evidence of thrombus. Normal compressibility and flow on color Doppler imaging. Venous Reflux:  None. Other Findings: Cystic lesions with septations and debris are seen in the popliteal fossa on both the right and left consistent with Baker's cysts. Cyst on the right measures 7.0 x 2.6 x 5.8 cm and cyst on the left measures 5.9 x 1.8 x 3.9 cm. IMPRESSION: No evidence of DVT within either lower extremity. Large bilateral Baker's cyst. Electronically Signed   By: Inge Rise M.D.   On: 12/03/2016 11:01   ASSESSMENT AND PLAN:   Patient is a 56 year old presenting to the emergency room with shortness of breath  1. Acute renal failure due to ATN and dehydration. Improving with IV fluids Normal renal ultrasound Follow-up BMP in a.m.   2. Shortness of breath, improved. normal ultrasound of his lower extremity Normal ventilation and perfusion scan.  echocardiogram is pending.  3. Leukocytosis, possible  due to  stress from renal failure and dehydration. Improved.  Urinalysis is normal.  4. Diabetes type 2 On sliding scale insulin, continue his Lantus Follow-up  hemoglobin A1c  5. Hyperlipidemia unspecified continue therapy with Crestor  6. Hyponatremia. Improved with normal saline IV.  Gout: recent acute gout flare. Follow-up Rheumatology, Dr. Meda Coffee as outpatient.  All the records are reviewed and case discussed with Care Management/Social Worker. Management plans discussed with the patient, his wife and they are in agreement.  CODE STATUS: Full Code  TOTAL TIME TAKING CARE OF THIS PATIENT: 33 minutes.   More than 50% of the time was spent in counseling/coordination of care: YES  POSSIBLE D/C IN 2 DAYS, DEPENDING ON CLINICAL CONDITION.   Demetrios Loll M.D on 12/03/2016 at 2:57 PM  Between 7am to 6pm - Pager - [phone]  After 6pm go to [email or web address] - Proofreader  [facility]  Office  [phone]  CC: Primary care physician; Katheren Shams  Note: This dictation was prepared with Dragon dictation along with smaller phrase technology. Any transcriptional errors that result from this process are unintentional.

## 2016-12-04 ENCOUNTER — Inpatient Hospital Stay: Payer: 59

## 2016-12-04 LAB — BASIC METABOLIC PANEL
ANION GAP: 5 (ref 5–15)
BUN: 26 mg/dL — ABNORMAL HIGH (ref 6–20)
CO2: 24 mmol/L (ref 22–32)
Calcium: 8.4 mg/dL — ABNORMAL LOW (ref 8.9–10.3)
Chloride: 106 mmol/L (ref 101–111)
Creatinine, Ser: 1.18 mg/dL (ref 0.61–1.24)
GFR calc Af Amer: >60 mL/min (ref >60)
GFR calc non Af Amer: >60 mL/min (ref >60)
Glucose, Bld: 120 mg/dL — ABNORMAL HIGH (ref 65–99)
POTASSIUM: 4.9 mmol/L (ref 3.5–5.1)
SODIUM: 135 mmol/L (ref 135–145)

## 2016-12-04 LAB — CBC
HEMATOCRIT: 33.6 % — AB (ref 40.0–52.0)
HEMOGLOBIN: 11.2 g/dL — AB (ref 13.0–18.0)
MCH: 27.6 pg (ref 26.0–34.0)
MCHC: 33.4 g/dL (ref 32.0–36.0)
MCV: 82.8 fL (ref 80.0–100.0)
Platelets: 270 10*3/uL (ref 150–440)
RBC: 4.06 MIL/uL — ABNORMAL LOW (ref 4.40–5.90)
RDW: 15.1 % — AB (ref 11.5–14.5)
WBC: 11.6 10*3/uL — AB (ref 3.8–10.6)

## 2016-12-04 LAB — GLUCOSE, CAPILLARY
Glucose-Capillary: 118 mg/dL — ABNORMAL HIGH (ref 65–99)
Glucose-Capillary: 137 mg/dL — ABNORMAL HIGH (ref 65–99)

## 2016-12-04 LAB — HEMOGLOBIN A1C
HEMOGLOBIN A1C: 11.3 % — AB (ref 4.8–5.6)
MEAN PLASMA GLUCOSE: 278 mg/dL

## 2016-12-04 LAB — SYNOVIAL CELL COUNT + DIFF, W/ CRYSTALS
Eosinophils-Synovial: 0 %
LYMPHOCYTES-SYNOVIAL FLD: 6 %
Monocyte-Macrophage-Synovial Fluid: 6 %
Neutrophil, Synovial: 88 %
OTHER CELLS-SYN: 0
WBC, Synovial: 5605 /mm3 — ABNORMAL HIGH (ref 0–200)

## 2016-12-04 MED ORDER — COLCHICINE 0.6 MG PO TABS: 0.6000 mg | ORAL_TABLET | Freq: Every day | ORAL | 0 refills | Status: DC

## 2016-12-04 MED ORDER — ALLOPURINOL 100 MG PO TABS
100.0000 mg | ORAL_TABLET | Freq: Every day | ORAL | Status: DC
  Filled 2016-12-04: qty 1

## 2016-12-04 MED ORDER — COLCHICINE 0.6 MG PO TABS
0.6000 mg | ORAL_TABLET | Freq: Every day | ORAL | Status: DC
  Filled 2016-12-04: qty 1

## 2016-12-04 MED ORDER — ALLOPURINOL 100 MG PO TABS: 100.0000 mg | ORAL_TABLET | Freq: Every day | ORAL | 0 refills | Status: DC

## 2016-12-04 NOTE — Progress Notes (Signed)
Per Dr. Bridgett Larsson okay to place order for PT consult

## 2016-12-04 NOTE — Consult Note (Signed)
Went to see patient.  Dr. Precious Reel already seeing patient.  There is no need for both orthopaedics and rheumatology to see this patient.  It is duplicating services.  Dr. Jefm Bryant with provide consultation services for this patient and may contact orthopaedics if necessary.   I have canceled the ortho consult for this patient.

## 2016-12-04 NOTE — Consult Note (Signed)
Reason for Consult: Bilateral knee pain   Referring Physician: Lenon Ahmadi   HPI: 56 year old white male. On a [employer]. Prior history of gout including prior history of gout in the knee. Used to be on allopurinol many years ago. Has been off of it. No significant family history no significant alcohol. Did have kidney stone in 2008 About a month ago he started having bilateral knee pain.  Then thought his knees were infected and he took doxycycline. No fever. No rash. No bites. Then prednisone taper. Saw orthopedist. At arthrocentesis with elevated white cells. Culture negative. Was given steroid with resolution. Was still on some doxycycline. The motorcycle vacation to the beach. The last few days said he was in the hot sun did not drink a lot. Felt poorly. Can emergency room and he was in acute renal insufficiency. Thought to be dehydration. Improved with IV fluids Since the knees and started paining again. Bilateral. Hurts to touch or to stand on them. Other joints have been quiet. He previously saw rheumatology. Was given colchicine and allopurinol but had not started it. Some shortness of breath and weakness. VQ scan unremarkable. Chest x-ray troponin unremarkable. Ultrasound showed bilateral Baker cysts. X-ray showed mild to [facility] changes  PMH: Diabetes. Glioma brainstem. Hyperlipidemia. Hypertension  SURGICAL HISTORY: Appendectomy. Hernia repair. Glioma surgery.  Family History: Coronary disease hypertension. Diabetes. No history of gout  Social History: No significant alcohol  Allergies: No Known Allergies  Medications: Not on colchicine or allopurinol.     ROS: No chest pain. No psoriasis. No other joints bothering. No fever. No shortness of breath. No blood per rectum. No abdominal pain.   PHYSICAL EXAM: Blood pressure 128/76, pulse 73, temperature 98.3 F (36.8 C), temperature source Oral, resp. rate 20, height 5\' 6"  (1.676 m), weight 101.8 kg (224 lb 8 oz), SpO2  100 %. Skin without psoriasis. Sclera clear. Clear chest. No significant murmur. Nontender abdomen. No visceromegaly. No significant edema Muscular skeletal: Good range of motion neck and shoulders. No elbow or tophi. Hands without synovitis. Hips move well. Bilateral knee effusions. No ankle synovitis MTPs nontender  Assessment: Bilateral knee arthropathy. Presumably crystalline given prior history of gout, hyperuricemia and elevated plantar markers. prior response to steroids Recent acute renal insufficiency, resolved. Creatinine down to 1.1 with IV fluids  Recommendations: PROCEDURE:  Left knee prepped in a sterile fashion Left knee aspirated 25 cc mildly cloudy fluid. Sent for microscopy Injected with 2 cc Xylocaine 2 cc Marcaine and 1 cc Kenalog  PROCEDURE:  Right knee prepped in a sterile fashion 7 cc aspirated mildly cloudy fluid. Was not Sent for microscopy. Injected with 2 cc Xylocaine 2 cc Marcaine and 1 cc Kenalog  Resume colchicine 0.6 g 1 by mouth daily. Allopurinol 100 mg by mouth daily. Follow-up with Dr. Meda Coffee as outpatient  Leeanne Mannan, [place] 12/04/2016, 1:36 PM

## 2016-12-04 NOTE — Progress Notes (Signed)
[facility]  ROUNDING NOTE   Subjective:   WIfe at bedside.  Creatinine back to baseline.   Complains of knee pain. Claims he cannot ambulate.   Objective:  Vital signs in last 24 hours:  Temp:  [98.3 F (36.8 C)-98.4 F (36.9 C)] 98.4 F (36.9 C) (05/23 1358) Pulse Rate:  [71-88] 80 (05/23 1358) Resp:  [16-20] 16 (05/23 1358) BP: (116-128)/(57-85) 122/85 (05/23 1358) SpO2:  [96 %-100 %] 96 % (05/23 1358)  Weight change:  Filed Weights   12/02/16 1422 12/02/16 1919  Weight: 106.6 kg (235 lb) 101.8 kg (224 lb 8 oz)    Intake/Output: I/O last 3 completed shifts: In: 3878.3 [P.O.:720; I.V.:3158.3] Out: 7939 [Urine:3825]   Intake/Output this shift:  Total I/O In: 750 [P.O.:480; I.V.:270] Out: 350 [Urine:350]  Physical Exam: General: NAD, laying in bed  Head: Normocephalic, atraumatic. Moist oral mucosal membranes  Eyes: Anicteric, PERRL  Neck: Supple, trachea midline  Lungs:  Clear to auscultation  Heart: Regular rate and rhythm  Abdomen:  Soft, nontender,   Extremities:  trace peripheral edema. Left Baker's cyst  Neurologic: Nonfocal, moving all four extremities  Skin: No lesions       Basic Metabolic Panel:  Recent Labs Lab 12/02/16 1427 12/03/16 0405 12/04/16 0511  NA 130* 136 135  K 4.7 4.3 4.9  CL 101 109 106  CO2 16* 22 24  GLUCOSE 264* 125* 120*  BUN 56* 48* 26*  CREATININE 2.68* 1.66* 1.18  CALCIUM 8.8* 8.3* 8.4*    Liver Function Tests:  Recent Labs Lab 12/02/16 1427  AST 18  ALT 14*  ALKPHOS 69  BILITOT 0.8  PROT 7.4  ALBUMIN 3.7   No results for input(s): LIPASE, AMYLASE in the last 168 hours. No results for input(s): AMMONIA in the last 168 hours.  CBC:  Recent Labs Lab 12/02/16 1427 12/03/16 0405 12/04/16 0511  WBC 16.2* 12.0* 11.6*  HGB 12.7* 11.1* 11.2*  HCT 37.9* 33.8* 33.6*  MCV 82.0 82.8 82.8  PLT 341 296 270    Cardiac Enzymes:  Recent Labs Lab 12/02/16 1427  TROPONINI <0.03     BNP: Invalid input(s): POCBNP  CBG:  Recent Labs Lab 12/03/16 1213 12/03/16 1644 12/03/16 2122 12/04/16 0756 12/04/16 1139  GLUCAP 121* 78 132* 118* 137*    Microbiology: Results for orders placed or performed during the hospital encounter of 11/20/16  Body fluid culture     Status: None   Collection Time: 11/20/16 10:20 AM  Result Value Ref Range Status   Specimen Description KNEE  Final   Special Requests NONE  Final   Gram Stain   Final    ABUNDANT WBC PRESENT,BOTH PMN AND MONONUCLEAR NO ORGANISMS SEEN    Culture   Final    NO GROWTH 3 DAYS Performed at [facility], [place] [address] [place], [place] [address]    Report Status 11/24/2016 FINAL  Final    Coagulation Studies: No results for input(s): LABPROT, INR in the last 72 hours.  Urinalysis:  Recent Labs  12/02/16 1728  COLORURINE YELLOW*  LABSPEC 1.015  PHURINE 5.0  GLUCOSEU NEGATIVE  HGBUR NEGATIVE  BILIRUBINUR NEGATIVE  KETONESUR NEGATIVE  PROTEINUR NEGATIVE  NITRITE NEGATIVE  LEUKOCYTESUR NEGATIVE      Imaging: Dg Knee 1-2 Views Left  Result Date: 12/04/2016 CLINICAL DATA:  Pain, weakness EXAM: LEFT KNEE - 1-2 VIEW COMPARISON:  None. FINDINGS: No fracture or dislocation is seen. Mild degenerative changes, with sharpening of the tibial  spines and small patellofemoral osteophytes. The visualized soft tissues are unremarkable. No definite suprapatellar knee joint effusion. IMPRESSION: Mild degenerative changes. Electronically Signed   By: Julian Hy M.D.   On: 12/04/2016 10:08   Dg Knee 1-2 Views Right  Result Date: 12/04/2016 CLINICAL DATA:  Pain, weakness EXAM: RIGHT KNEE - 1-2 VIEW COMPARISON:  None. FINDINGS: No fracture or dislocation is seen. Mild degenerative changes, with sharpening of the tibial spines and mild tricompartmental osteophytosis. The visualized soft tissues are unremarkable. No suprapatellar knee joint effusion. IMPRESSION: Mild degenerative changes.  Electronically Signed   By: Julian Hy M.D.   On: 12/04/2016 10:08   US Renal  Result Date: 12/03/2016 CLINICAL DATA:  Acute renal failure. EXAM: RENAL / URINARY TRACT ULTRASOUND COMPLETE COMPARISON:  None. FINDINGS: Right Kidney: Length: 10.2 cm. Echogenicity within normal limits. No mass or hydronephrosis visualized. Left Kidney: Length: 10.1 cm. Echogenicity within normal limits. No mass or hydronephrosis visualized. Bladder: Appears normal for degree of bladder distention. IMPRESSION: Negative for hydronephrosis.  Normal exam. Electronically Signed   By: Inge Rise M.D.   On: 12/03/2016 11:03   Nm Pulmonary Perf And Vent  Result Date: 12/03/2016 CLINICAL DATA:  Short of breath EXAM: NUCLEAR MEDICINE VENTILATION - PERFUSION LUNG SCAN TECHNIQUE: Ventilation images were obtained in multiple projections using inhaled aerosol Tc-45m DTPA. Perfusion images were obtained in multiple projections after intravenous injection of Tc-8m MAA. RADIOPHARMACEUTICALS:  32.8 mCi Technetium-6m DTPA aerosol inhalation and 4.4 mCi Technetium-101m MAA IV COMPARISON:  Chest x-ray 12/02/2016 FINDINGS: Ventilation: No focal ventilation defect. Perfusion: No wedge shaped peripheral perfusion defects to suggest acute pulmonary embolism. IMPRESSION: Normal ventilation and perfusion scan. Electronically Signed   By: Franchot Gallo M.D.   On: 12/03/2016 14:02   US Venous Img Lower Bilateral  Result Date: 12/03/2016 CLINICAL DATA:  Bilateral lower extremity swelling and pain for 1 month. No known injury. EXAM: BILATERAL LOWER EXTREMITY VENOUS DOPPLER ULTRASOUND TECHNIQUE: Gray-scale sonography with graded compression, as well as color Doppler and duplex ultrasound were performed to evaluate the lower extremity deep venous systems from the level of the common femoral vein and including the common femoral, femoral, profunda femoral, popliteal and calf veins including the posterior tibial, peroneal and gastrocnemius  veins when visible. The superficial great saphenous vein was also interrogated. Spectral Doppler was utilized to evaluate flow at rest and with distal augmentation maneuvers in the common femoral, femoral and popliteal veins. COMPARISON:  None. FINDINGS: RIGHT LOWER EXTREMITY Common Femoral Vein: No evidence of thrombus. Normal compressibility, respiratory phasicity and response to augmentation. Saphenofemoral Junction: No evidence of thrombus. Normal compressibility and flow on color Doppler imaging. Profunda Femoral Vein: No evidence of thrombus. Normal compressibility and flow on color Doppler imaging. Femoral Vein: No evidence of thrombus. Normal compressibility, respiratory phasicity and response to augmentation. Popliteal Vein: No evidence of thrombus. Normal compressibility, respiratory phasicity and response to augmentation. Calf Veins: No evidence of thrombus. Normal compressibility and flow on color Doppler imaging. Superficial Great Saphenous Vein: No evidence of thrombus. Normal compressibility and flow on color Doppler imaging. Venous Reflux:  None. Other Findings:  None. LEFT LOWER EXTREMITY Common Femoral Vein: No evidence of thrombus. Normal compressibility, respiratory phasicity and response to augmentation. Saphenofemoral Junction: No evidence of thrombus. Normal compressibility and flow on color Doppler imaging. Profunda Femoral Vein: No evidence of thrombus. Normal compressibility and flow on color Doppler imaging. Femoral Vein: No evidence of thrombus. Normal compressibility, respiratory phasicity and response to augmentation. Popliteal Vein: No evidence  of thrombus. Normal compressibility, respiratory phasicity and response to augmentation. Calf Veins: No evidence of thrombus. Normal compressibility and flow on color Doppler imaging. Superficial Great Saphenous Vein: No evidence of thrombus. Normal compressibility and flow on color Doppler imaging. Venous Reflux:  None. Other Findings: Cystic  lesions with septations and debris are seen in the popliteal fossa on both the right and left consistent with Baker's cysts. Cyst on the right measures 7.0 x 2.6 x 5.8 cm and cyst on the left measures 5.9 x 1.8 x 3.9 cm. IMPRESSION: No evidence of DVT within either lower extremity. Large bilateral Baker's cyst. Electronically Signed   By: Inge Rise M.D.   On: 12/03/2016 11:01     Medications:    . allopurinol  100 mg Oral Daily  . carvedilol  6.25 mg Oral BID  . colchicine  0.6 mg Oral Daily  . heparin  5,000 Units Subcutaneous Q8H  . insulin aspart  0-9 Units Subcutaneous TID WC  . insulin glargine  30 Units Subcutaneous Daily  . rosuvastatin  20 mg Oral Daily   acetaminophen **OR** acetaminophen, ondansetron **OR** ondansetron (ZOFRAN) IV, traMADol  Assessment/ Plan:  Mr. Randy Luna is a 56 y.o. white male Mr. Randy Luna is a 56 y.o. white male with diabetes mellitus type II, hypertension, gout, hyperlipidemia, history of intracranial tumor, appendectomy, who was admitted to [facility] on 12/02/2016 for SOB (shortness of breath) [R06.02] Bilateral leg pain [M79.604, M79.605] Acute renal failure (ARF) ([place]) [N17.9] AKI (acute kidney injury) ([place]) [N17.9]  1. Acute renal failure: baseline creatinine of 1.14 on 10/2016  Acute renal failure secondary to prerenal azotemia. Improving renal function with IV fluids.  Labs reviewed. Encourage PO intake.   2. Hypertension: blood pressure at goal.  - continue carvedilol - holding lisinopril and hydrochlorothiazide. Will need to restart as outpatient.   3. Gout: recent acute gout flare. Uric acid elevated.  - appreciate Dr. Scharlene Gloss input.   4. Diabetes mellitus type II with renal manifestations: on metformin.  - continue glucose control.    LOS: 2 Chandell Attridge 5/23/20183:13 PM

## 2016-12-04 NOTE — Discharge Summary (Signed)
[place] [facility] [place] NAME: Randy Luna    MR#:  [id number]  DATE OF BIRTH:  01-Aug-1960  DATE OF ADMISSION:  12/02/2016   ADMITTING PHYSICIAN: Dustin Flock, MD  DATE OF DISCHARGE: 12/04/2016  PRIMARY CARE PHYSICIAN: [facility], Jefm Bryant   ADMISSION DIAGNOSIS:  SOB (shortness of breath) [R06.02] Bilateral leg pain [M79.604, M79.605] Acute renal failure (ARF) ([facility]) [N17.9] AKI (acute kidney injury) ([place]) [N17.9] DISCHARGE DIAGNOSIS:  Active Problems:   Acute renal failure (ARF) ([place])  SECONDARY DIAGNOSIS:   Past Medical History:  Diagnosis Date  . Diabetes mellitus without complication ([place])   . Hypertension    HOSPITAL COURSE:   Patient is a 56 year old presenting to the emergency room with shortness of breath  1. Acute renal failure due to ATN and dehydration. Improved with IV fluids.  Normal renal ultrasound.             2. Shortness of breath, improved. normal ultrasound of his lower extremity Normal ventilation and perfusion scan.  echocardiogram is pending. Follow-up report as outpatient.  3. Leukocytosis, possible due to stress from renal failure and dehydration. Improved.  Urinalysis is normal.  4. Diabetes type 2 On sliding scale insulin, continue his Lantus, resume by mouth metformin and glimepiride. Follow-up  hemoglobin A1c 5.4.  5. Hyperlipidemia unspecified continue therapy with Crestor  6. Hyponatremia. Improved with normal saline IV.  Hypertension. Resume home hypertension medication.  Gout: recent acute gout flare.  The patient got bilateral knee aspiration and injected with 2 cc Xylocaine 2 cc Marcaine and 1 cc Kenalog by Dr. Jefm Bryant. He suggests resume colchicine 0.6 g 1 by mouth daily. Allopurinol 100 mg by mouth daily. Follow-up Rheumatology, Dr. Meda Coffee as outpatient.  DISCHARGE CONDITIONS:  Stable, discharge to home today. CONSULTS OBTAINED:  Treatment Team:  Lavonia Dana,  MD [place], MD Emmaline Kluver., MD DRUG ALLERGIES:  No Known Allergies DISCHARGE MEDICATIONS:   Allergies as of 12/04/2016   No Known Allergies     Medication List    STOP taking these medications   doxycycline 100 MG capsule Commonly known as:  VIBRAMYCIN     TAKE these medications   allopurinol 100 MG tablet Commonly known as:  ZYLOPRIM Take 1 tablet (100 mg total) by mouth daily. What changed:  how much to take   carvedilol 6.25 MG tablet Commonly known as:  COREG Take 1 tablet by mouth 2 (two) times daily.   colchicine 0.6 MG tablet Take 1 tablet (0.6 mg total) by mouth daily.   glimepiride 4 MG tablet Commonly known as:  AMARYL Take 1 tablet by mouth daily.   LANTUS SOLOSTAR 100 UNIT/ML Solostar Pen Generic drug:  Insulin Glargine Inject 40 Units into the skin daily.   lisinopril-hydrochlorothiazide 20-12.5 MG tablet Commonly known as:  PRINZIDE,ZESTORETIC Take 1 tablet by mouth daily.   metFORMIN 1000 MG tablet Commonly known as:  GLUCOPHAGE Take 1 tablet by mouth 2 (two) times daily.   rosuvastatin 20 MG tablet Commonly known as:  CRESTOR Take 1 tablet by mouth daily.   traMADol 50 MG tablet Commonly known as:  ULTRAM Take 2 tablets by mouth every 6 (six) hours as needed. Notes to patient:  Last dose given 12/04/16 at 4:25am        DISCHARGE INSTRUCTIONS:  See AVS.  If you experience worsening of your admission symptoms, develop shortness of breath, life threatening emergency, suicidal or homicidal thoughts you must seek medical attention immediately by  calling 911 or calling your MD immediately  if symptoms less severe.  You Must read complete instructions/literature along with all the possible adverse reactions/side effects for all the Medicines you take and that have been prescribed to you. Take any new Medicines after you have completely understood and accpet all the possible adverse reactions/side effects.   Please  note  You were cared for by a hospitalist during your hospital stay. If you have any questions about your discharge medications or the care you received while you were in the hospital after you are discharged, you can call the unit and asked to speak with the hospitalist on call if the hospitalist that took care of you is not available. Once you are discharged, your primary care physician will handle any further medical issues. Please note that NO REFILLS for any discharge medications will be authorized once you are discharged, as it is imperative that you return to your primary care physician (or establish a relationship with a primary care physician if you do not have one) for your aftercare needs so that they can reassess your need for medications and monitor your lab values.    On the day of Discharge:  VITAL SIGNS:  Blood pressure 122/85, pulse 80, temperature 98.4 F (36.9 C), temperature source Oral, resp. rate 16, height 5\' 6"  (1.676 m), weight 224 lb 8 oz (101.8 kg), SpO2 96 %. PHYSICAL EXAMINATION:  GENERAL:  56 y.o.-year-old patient lying in the bed with no acute distress. Obese. EYES: Pupils equal, round, reactive to light and accommodation. No scleral icterus. Extraocular muscles intact.  HEENT: Head atraumatic, normocephalic. Oropharynx and nasopharynx clear.  NECK:  Supple, no jugular venous distention. No thyroid enlargement, no tenderness.  LUNGS: Normal breath sounds bilaterally, no wheezing, rales,rhonchi or crepitation. No use of accessory muscles of respiration.  CARDIOVASCULAR: S1, S2 normal. No murmurs, rubs, or gallops.  ABDOMEN: Soft, non-tender, non-distended. Bowel sounds present. No organomegaly or mass.  EXTREMITIES: No pedal edema, cyanosis, or clubbing.  NEUROLOGIC: Cranial nerves II through XII are intact. Muscle strength 5/5 in all extremities. Sensation intact. Gait not checked.  PSYCHIATRIC: The patient is alert and oriented x 3.  SKIN: No obvious rash, lesion,  or ulcer.  DATA REVIEW:   CBC  Recent Labs Lab 12/04/16 0511  WBC 11.6*  HGB 11.2*  HCT 33.6*  PLT 270    Chemistries   Recent Labs Lab 12/02/16 1427  12/04/16 0511  NA 130*  < > 135  K 4.7  < > 4.9  CL 101  < > 106  CO2 16*  < > 24  GLUCOSE 264*  < > 120*  BUN 56*  < > 26*  CREATININE 2.68*  < > 1.18  CALCIUM 8.8*  < > 8.4*  AST 18  --   --   ALT 14*  --   --   ALKPHOS 69  --   --   BILITOT 0.8  --   --   < > = values in this interval not displayed.   Microbiology Results  Results for orders placed or performed during the hospital encounter of 11/20/16  Body fluid culture     Status: None   Collection Time: 11/20/16 10:20 AM  Result Value Ref Range Status   Specimen Description KNEE  Final   Special Requests NONE  Final   Gram Stain   Final    ABUNDANT WBC PRESENT,BOTH PMN AND MONONUCLEAR NO ORGANISMS SEEN    Culture  Final    NO GROWTH 3 DAYS Performed at [facility], [place] [address], [place], [place] [address]    Report Status 11/24/2016 FINAL  Final    RADIOLOGY:  Dg Knee 1-2 Views Left  Result Date: 12/04/2016 CLINICAL DATA:  Pain, weakness EXAM: LEFT KNEE - 1-2 VIEW COMPARISON:  None. FINDINGS: No fracture or dislocation is seen. Mild degenerative changes, with sharpening of the tibial spines and small patellofemoral osteophytes. The visualized soft tissues are unremarkable. No definite suprapatellar knee joint effusion. IMPRESSION: Mild degenerative changes. Electronically Signed   By: Julian Hy M.D.   On: 12/04/2016 10:08   Dg Knee 1-2 Views Right  Result Date: 12/04/2016 CLINICAL DATA:  Pain, weakness EXAM: RIGHT KNEE - 1-2 VIEW COMPARISON:  None. FINDINGS: No fracture or dislocation is seen. Mild degenerative changes, with sharpening of the tibial spines and mild tricompartmental osteophytosis. The visualized soft tissues are unremarkable. No suprapatellar knee joint effusion. IMPRESSION: Mild degenerative changes. Electronically  Signed   By: Julian Hy M.D.   On: 12/04/2016 10:08     Management plans discussed with the patient, his wife and they are in agreement.  CODE STATUS: Full Code   TOTAL TIME TAKING CARE OF THIS PATIENT: 45 minutes.    Demetrios Loll M.D on 12/04/2016 at 2:31 PM  Between 7am to 6pm - Pager - [phone]  After 6pm go to [email or web address] - Proofreader  [facility]  Office  [phone]  CC: Primary care physician; Katheren Shams   Note: This dictation was prepared with Dragon dictation along with smaller phrase technology. Any transcriptional errors that result from this process are unintentional.

## 2016-12-04 NOTE — Progress Notes (Signed)
Pt A and O x 4. VSS. Pt tolerating diet well. Minimal complaints of pain and no nausea. IV removed intact, prescriptions given. Pt voiced understanding of discharge instructions with no further questions. Verified with MD that pt is  Okay to discharge per MD even with elevated WBCs in the synovial fluid of knee. Pt discharged via wheelchair with axillary.

## 2016-12-04 NOTE — Accreditation Note (Signed)
Inpatient Diabetes Program Recommendations  AACE/ADA: New Consensus Statement on Inpatient Glycemic Control (2015)  Target Ranges:  Prepandial:   less than 140 mg/dL      Peak postprandial:   less than 180 mg/dL (1-2 hours)      Critically ill patients:  140 - 180 mg/dL   Lab Results  Component Value Date   GLUCAP 137 (H) 12/04/2016   HGBA1C 11.3 (H) 12/03/2016   Diabetes history: Type 2 diabetes Outpatient Diabetes medications: Lantus 40 units daily, Amaryl 4 mg daily Current orders for Inpatient glycemic control:  Lantus 30 units daily, Novolog sensitive tid with meals.   Inpatient Diabetes Program Recommendations:    Note that A1C in April at Dr. Ammie Ferrier office was 10.2%.  A1c now elevated further.  Discussed with patient.  He states that he was on steroids prior to admit and that this likely increased his blood sugars. He plans to follow-up with Dr. Sabra Heck.  Note that blood sugars < 150 mg/dL here in the hospital. Patient discussed hypoglycemia symptoms and treatment.  Explained that if blood sugars increase with steroids, he needs to call MD to see if insulin adjustments need to be made.  Patient verbalized understanding. No further needs at this time.  Thanks, Adah Perl, RN, BC-ADM Inpatient Diabetes Coordinator Pager [phone] (8a-5p)

## 2016-12-04 NOTE — Evaluation (Signed)
Physical Therapy Evaluation Patient Details Name: Randy Luna MRN: [id number] DOB: 1961-05-11 Today's Date: 12/04/2016   History of Present Illness  Pt is a 56 y/o M who presented with SOB and feeling weak as well as reporting Bil knee pain.  In the ED the pt was noted to have acute renal failure.  Normal [place] of his lower extremity.  Suspect gout flare up.  The pt received injections in Bil knees with significant decrease in pain.  Pt's PMH includes brain tumor removed.    Clinical Impression  Pt admitted with above diagnosis. Pt reports 5/10 Bil knee pain which improves as he ambulates with decreased stiffness.  He is independent with bed mobility, transfers, and ambulation.  He does use Bil rails with step to pattern descending steps and alternating pattern ascending steps.  No instability noted with high level balance testing when ambulating.  No skilled PT needs identified.  PT will sign off.     Follow Up Recommendations No PT follow up    Equipment Recommendations  None recommended by PT    Recommendations for Other Services       Precautions / Restrictions Precautions Precautions: Fall Restrictions Weight Bearing Restrictions: No      Mobility  Bed Mobility Overal bed mobility: Independent             General bed mobility comments: No physical assist or cues needed.  Transfers Overall transfer level: Independent Equipment used: None             General transfer comment: Pt slow to stand as he this is his first time up following injections.  Pt steady and does not requires cues or physical assist.  Ambulation/Gait Ambulation/Gait assistance: Independent Ambulation Distance (Feet): 230 Feet Assistive device: None Gait Pattern/deviations: Wide base of support   Gait velocity interpretation: at or above normal speed for age/gender General Gait Details: Pt ambulates with wide BOS as he is anticipating pain; however, the farther the pt ambulates the less  stiff and less pain he feels in his knees.  No unsteadiness noted.    Stairs Stairs: Yes Stairs assistance: Modified independent (Device/Increase time) Stair Management: Two rails;Alternating pattern;Step to pattern;Forwards Number of Stairs: 4 General stair comments: Step to pattern during descent and alternating with ascent.  Modified independent as pt uses railings for support.  Wheelchair Mobility    Modified Rankin (Stroke Patients Only)       Balance Overall balance assessment: Independent                               Standardized Balance Assessment Standardized Balance Assessment : Dynamic Gait Index   Dynamic Gait Index Level Surface: Mild Impairment (wide BOS) Change in Gait Speed: Normal Gait with Horizontal Head Turns: Normal Gait with Vertical Head Turns: Normal Gait and Pivot Turn: Normal Step Over Obstacle: Normal Step Around Obstacles: Normal Steps: Moderate Impairment (step to pattern during descent.  Uses Bil railings.) Total Score: 21       Pertinent Vitals/Pain Pain Assessment: 0-10 Pain Score: 5  Pain Location: Bil knees Pain Descriptors / Indicators: Aching Pain Intervention(s): Limited activity within patient's tolerance;Monitored during session    [place] expects to be discharged to:: Private residence Living Arrangements: Spouse/significant other Available Help at Discharge: Family;Available 24 hours/day (wife to stay at home with husband until next Monday) Type of Home: House Home Access: Stairs to enter Entrance Stairs-Rails: Left;Right;Can reach both  Entrance Stairs-Number of Steps: 4 Home Layout: One level Home Equipment: Walker - 2 wheels;Cane - quad;Cane - single point      Prior Function Level of Independence: Independent         Comments: Pt works full time at a [employer].  He spends 90% of his time at work sitting and the other 10% walking.  He denies any falls in the past 6 months.        Hand Dominance        Extremity/Trunk Assessment   Upper Extremity Assessment Upper Extremity Assessment: Overall WFL for tasks assessed    Lower Extremity Assessment Lower Extremity Assessment:  (Pt politely refuses formal strength testing due to pain)    Cervical / Trunk Assessment Cervical / Trunk Assessment: Normal  Communication   Communication: No difficulties  Cognition Arousal/Alertness: Awake/alert Behavior During Therapy: WFL for tasks assessed/performed Overall Cognitive Status: Within Functional Limits for tasks assessed                                        General Comments General comments (skin integrity, edema, etc.): Wife present during evaluation    Exercises Other Exercises Other Exercises: Pt instructed to have his wife with him when going up/down steps at home.     Assessment/Plan    PT Assessment Patent does not need any further PT services  PT Problem List         PT Treatment Interventions      PT Goals (Current goals can be found in the Care Plan section)  Acute Rehab PT Goals Patient Stated Goal: to go home today PT Goal Formulation: All assessment and education complete, DC therapy    Frequency     Barriers to discharge        Co-evaluation               AM-PAC PT "6 Clicks" Daily Activity  Outcome Measure Difficulty turning over in bed (including adjusting bedclothes, sheets and blankets)?: None Difficulty moving from lying on back to sitting on the side of the bed? : None Difficulty sitting down on and standing up from a chair with arms (e.g., wheelchair, bedside commode, etc,.)?: None Help needed moving to and from a bed to chair (including a wheelchair)?: None Help needed walking in hospital room?: None Help needed climbing 3-5 steps with a railing? : None 6 Click Score: 24    End of Session Equipment Utilized During Treatment: Gait belt Activity Tolerance: Patient tolerated treatment  well Patient left: in bed;with call bell/phone within reach;with family/visitor present Nurse Communication: Mobility status PT Visit Diagnosis: Pain Pain - Right/Left: Right Pain - part of body: Knee    Time:  -      Charges:         PT G CodesCollie Siad PT, DPT 12/04/2016, 3:35 PM

## 2016-12-04 NOTE — Discharge Instructions (Signed)
Heart healthy and ADA diet. °

## 2016-12-04 NOTE — Progress Notes (Signed)
Nursing student and instructor went in to give pt colchicine and allopurinol. Per pt he stated that he had brought in the meds that had been prescribed to him prior to admission and took those. Nursing instructor to mark medications as not given.

## 2016-12-12 LAB — ECHOCARDIOGRAM COMPLETE
HEIGHTINCHES: 66 in
Weight: 3592 oz

## 2017-09-21 ENCOUNTER — Emergency Department: Payer: Managed Care, Other (non HMO)

## 2017-09-21 ENCOUNTER — Encounter: Payer: Self-pay | Admitting: Emergency Medicine

## 2017-09-21 ENCOUNTER — Inpatient Hospital Stay
Admission: EM | Admit: 2017-09-21 | Discharge: 2017-09-22 | DRG: 282 | Disposition: A | Discharge status: Other Acute Inpatient Hospital | Payer: Managed Care, Other (non HMO) | Attending: Internal Medicine | Admitting: Internal Medicine

## 2017-09-21 ENCOUNTER — Other Ambulatory Visit: Payer: Self-pay

## 2017-09-21 DIAGNOSIS — Z6837 Body mass index (BMI) 37.0-37.9, adult: Secondary | ICD-10-CM

## 2017-09-21 DIAGNOSIS — E785 Hyperlipidemia, unspecified: Secondary | ICD-10-CM | POA: Diagnosis present

## 2017-09-21 DIAGNOSIS — I214 Non-ST elevation (NSTEMI) myocardial infarction: Secondary | ICD-10-CM | POA: Diagnosis present

## 2017-09-21 DIAGNOSIS — I2510 Atherosclerotic heart disease of native coronary artery without angina pectoris: Secondary | ICD-10-CM | POA: Diagnosis present

## 2017-09-21 DIAGNOSIS — E1165 Type 2 diabetes mellitus with hyperglycemia: Secondary | ICD-10-CM | POA: Diagnosis present

## 2017-09-21 DIAGNOSIS — R7989 Other specified abnormal findings of blood chemistry: Secondary | ICD-10-CM

## 2017-09-21 DIAGNOSIS — Z833 Family history of diabetes mellitus: Secondary | ICD-10-CM | POA: Diagnosis not present

## 2017-09-21 DIAGNOSIS — R778 Other specified abnormalities of plasma proteins: Secondary | ICD-10-CM

## 2017-09-21 DIAGNOSIS — E1122 Type 2 diabetes mellitus with diabetic chronic kidney disease: Secondary | ICD-10-CM | POA: Diagnosis present

## 2017-09-21 DIAGNOSIS — I129 Hypertensive chronic kidney disease with stage 1 through stage 4 chronic kidney disease, or unspecified chronic kidney disease: Secondary | ICD-10-CM | POA: Diagnosis present

## 2017-09-21 DIAGNOSIS — I259 Chronic ischemic heart disease, unspecified: Secondary | ICD-10-CM

## 2017-09-21 DIAGNOSIS — N182 Chronic kidney disease, stage 2 (mild): Secondary | ICD-10-CM | POA: Diagnosis present

## 2017-09-21 DIAGNOSIS — Z794 Long term (current) use of insulin: Secondary | ICD-10-CM | POA: Diagnosis not present

## 2017-09-21 DIAGNOSIS — R079 Chest pain, unspecified: Secondary | ICD-10-CM | POA: Diagnosis present

## 2017-09-21 LAB — HEPATIC FUNCTION PANEL
ALBUMIN: 3.8 g/dL (ref 3.5–5.0)
ALT: 18 U/L (ref 17–63)
AST: 32 U/L (ref 15–41)
Alkaline Phosphatase: 68 U/L (ref 38–126)
TOTAL PROTEIN: 7.2 g/dL (ref 6.5–8.1)
Total Bilirubin: 0.7 mg/dL (ref 0.3–1.2)

## 2017-09-21 LAB — APTT: APTT: 30 s (ref 24–36)

## 2017-09-21 LAB — BASIC METABOLIC PANEL
Anion gap: 13 (ref 5–15)
BUN: 17 mg/dL (ref 6–20)
CALCIUM: 9 mg/dL (ref 8.9–10.3)
CO2: 19 mmol/L — ABNORMAL LOW (ref 22–32)
CREATININE: 1.27 mg/dL — AB (ref 0.61–1.24)
Chloride: 102 mmol/L (ref 101–111)
Glucose, Bld: 491 mg/dL — ABNORMAL HIGH (ref 65–99)
Potassium: 4.7 mmol/L (ref 3.5–5.1)
Sodium: 134 mmol/L — ABNORMAL LOW (ref 135–145)

## 2017-09-21 LAB — CBC
HCT: 41.2 % (ref 40.0–52.0)
Hemoglobin: 13.6 g/dL (ref 13.0–18.0)
MCH: 28 pg (ref 26.0–34.0)
MCHC: 33.1 g/dL (ref 32.0–36.0)
MCV: 84.5 fL (ref 80.0–100.0)
PLATELETS: 406 10*3/uL (ref 150–440)
RBC: 4.88 MIL/uL (ref 4.40–5.90)
RDW: 16.5 % — ABNORMAL HIGH (ref 11.5–14.5)
WBC: 10.1 10*3/uL (ref 3.8–10.6)

## 2017-09-21 LAB — TROPONIN I
TROPONIN I: 0.23 ng/mL — AB (ref <0.03)
TROPONIN I: 0.47 ng/mL — AB (ref <0.03)

## 2017-09-21 LAB — GLUCOSE, CAPILLARY: Glucose-Capillary: 251 mg/dL — ABNORMAL HIGH (ref 65–99)

## 2017-09-21 LAB — HEPARIN LEVEL (UNFRACTIONATED): Heparin Unfractionated: 0.2 IU/mL — ABNORMAL LOW (ref 0.30–0.70)

## 2017-09-21 LAB — PROTIME-INR
INR: 0.87
PROTHROMBIN TIME: 11.8 s (ref 11.4–15.2)

## 2017-09-21 LAB — HEMOGLOBIN A1C
HEMOGLOBIN A1C: 10.6 % — AB (ref 4.8–5.6)
MEAN PLASMA GLUCOSE: 257.52 mg/dL

## 2017-09-21 MED ORDER — SODIUM CHLORIDE 0.9 % IV SOLN
INTRAVENOUS | Status: DC
  Administered 2017-09-21 – 2017-09-22 (×2): via INTRAVENOUS

## 2017-09-21 MED ORDER — METOPROLOL TARTRATE 25 MG PO TABS: 25.0000 mg | ORAL_TABLET | Freq: Two times a day (BID) | ORAL | Status: DC

## 2017-09-21 MED ORDER — ASPIRIN EC 325 MG PO TBEC
325.0000 mg | DELAYED_RELEASE_TABLET | Freq: Every day | ORAL | Status: DC
  Administered 2017-09-21: 325 mg via ORAL
  Filled 2017-09-21: qty 1

## 2017-09-21 MED ORDER — COLCHICINE 0.6 MG PO TABS
0.6000 mg | ORAL_TABLET | Freq: Every day | ORAL | Status: DC
  Administered 2017-09-21: 0.6 mg via ORAL
  Filled 2017-09-21: qty 1

## 2017-09-21 MED ORDER — IOPAMIDOL (ISOVUE-370) INJECTION 76%: 75.0000 mL | Freq: Once | INTRAVENOUS | Status: DC | PRN

## 2017-09-21 MED ORDER — HYDRALAZINE HCL 20 MG/ML IJ SOLN: 10.0000 mg | Freq: Four times a day (QID) | INTRAMUSCULAR | Status: DC | PRN

## 2017-09-21 MED ORDER — HEPARIN BOLUS VIA INFUSION
1300.0000 [IU] | Freq: Once | INTRAVENOUS | Status: AC
  Administered 2017-09-22: 1300 [IU] via INTRAVENOUS
  Filled 2017-09-21: qty 1300

## 2017-09-21 MED ORDER — NITROGLYCERIN 2 % TD OINT
0.5000 [in_us] | TOPICAL_OINTMENT | Freq: Four times a day (QID) | TRANSDERMAL | Status: DC
  Administered 2017-09-22 (×2): 0.5 [in_us] via TOPICAL
  Filled 2017-09-21 (×2): qty 1

## 2017-09-21 MED ORDER — INSULIN GLARGINE 100 UNIT/ML ~~LOC~~ SOLN
50.0000 [IU] | Freq: Every day | SUBCUTANEOUS | Status: DC
  Administered 2017-09-21: 50 [IU] via SUBCUTANEOUS
  Filled 2017-09-21 (×3): qty 0.5

## 2017-09-21 MED ORDER — CARVEDILOL 6.25 MG PO TABS: 6.2500 mg | ORAL_TABLET | Freq: Two times a day (BID) | ORAL | Status: DC

## 2017-09-21 MED ORDER — HEPARIN (PORCINE) IN NACL 100-0.45 UNIT/ML-% IJ SOLN
1400.0000 [IU]/h | INTRAMUSCULAR | Status: DC
  Administered 2017-09-21: 1200 [IU]/h via INTRAVENOUS
  Administered 2017-09-22: 1400 [IU]/h via INTRAVENOUS
  Filled 2017-09-21 (×3): qty 250

## 2017-09-21 MED ORDER — ALLOPURINOL 100 MG PO TABS
100.0000 mg | ORAL_TABLET | Freq: Every day | ORAL | Status: DC
  Filled 2017-09-21: qty 1

## 2017-09-21 MED ORDER — METFORMIN HCL 500 MG PO TABS: 1000.0000 mg | ORAL_TABLET | Freq: Two times a day (BID) | ORAL | Status: DC

## 2017-09-21 MED ORDER — ROSUVASTATIN CALCIUM 20 MG PO TABS: 20.0000 mg | ORAL_TABLET | Freq: Every day | ORAL | Status: DC

## 2017-09-21 MED ORDER — ATORVASTATIN CALCIUM 20 MG PO TABS
80.0000 mg | ORAL_TABLET | Freq: Every day | ORAL | Status: DC
  Administered 2017-09-21 – 2017-09-22 (×2): 80 mg via ORAL
  Filled 2017-09-21 (×2): qty 4

## 2017-09-21 MED ORDER — CARVEDILOL 12.5 MG PO TABS
12.5000 mg | ORAL_TABLET | Freq: Two times a day (BID) | ORAL | Status: DC
  Administered 2017-09-21 – 2017-09-22 (×3): 12.5 mg via ORAL
  Filled 2017-09-21 (×3): qty 1

## 2017-09-21 MED ORDER — HEPARIN BOLUS VIA INFUSION
4000.0000 [IU] | Freq: Once | INTRAVENOUS | Status: AC
  Administered 2017-09-21: 4000 [IU] via INTRAVENOUS
  Filled 2017-09-21: qty 4000

## 2017-09-21 NOTE — Progress Notes (Addendum)
ANTICOAGULATION CONSULT NOTE  Pharmacy Consult for Heparin Indication: chest pain/ACS  No Known Allergies  Patient Measurements: Height: 5\' 6"  (167.6 cm) Weight: 235 lb (106.6 kg) IBW/kg (Calculated) : 63.8 Heparin Dosing Weight: 88 kg  Vital Signs: Temp: 98.3 F (36.8 C) (03/10 1950) Temp Source: Oral (03/10 1950) BP: 160/97 (03/10 1950) Pulse Rate: 83 (03/10 1950)  Labs: Recent Labs    09/21/17 1409 09/21/17 1440 09/21/17 1848 09/21/17 2200  HGB 13.6  --   --   --   HCT 41.2  --   --   --   PLT 406  --   --   --   APTT  --  30  --   --   LABPROT  --  11.8  --   --   INR  --  0.87  --   --   HEPARINUNFRC  --   --   --  0.20*  CREATININE 1.27*  --   --   --   TROPONINI 0.23*  --  0.47*  --     Estimated Creatinine Clearance: 74.3 mL/min (A) (by C-G formula based on SCr of 1.27 mg/dL (H)).   Medical History: Past Medical History:  Diagnosis Date  . Diabetes mellitus without complication ([place])   . Hypertension    Assessment: 57 y/o M admitted with chest pain not on anticoagulants PTA.   Goal of Therapy:  Heparin level 0.3-0.7 units/ml Monitor platelets by anticoagulation protocol: Yes   Plan:  Give 4000 units bolus x 1 Start heparin infusion at 1200 units/hr Check anti-Xa level in 6 hours and daily while on heparin Continue to monitor H&H and platelets  3/10 PM heparin level 0.20. 1300 unit bolus and increase rate to 1400 units/hr. Recheck with AM labs.  3/11 AM heparin level 0.41. Continue current regimen. Recheck in 6 hours to confirm.  Tatum Massman S 09/21/2017,11:23 PM

## 2017-09-21 NOTE — Progress Notes (Signed)
ANTICOAGULATION CONSULT NOTE  Pharmacy Consult for Heparin Indication: chest pain/ACS  No Known Allergies  Patient Measurements: Height: 5\' 6"  (167.6 cm) Weight: 235 lb (106.6 kg) IBW/kg (Calculated) : 63.8 Heparin Dosing Weight: 88 kg  Vital Signs: Temp: 97.9 F (36.6 C) (03/10 1411) Temp Source: Oral (03/10 1411) BP: 175/114 (03/10 1411) Pulse Rate: 105 (03/10 1411)  Labs: Recent Labs    09/21/17 1409  HGB 13.6  HCT 41.2  PLT 406  CREATININE 1.27*  TROPONINI 0.23*    Estimated Creatinine Clearance: 74.3 mL/min (A) (by C-G formula based on SCr of 1.27 mg/dL (H)).   Medical History: Past Medical History:  Diagnosis Date  . Diabetes mellitus without complication ([place])   . Hypertension    Assessment: 57 y/o M admitted with chest pain not on anticoagulants PTA.   Goal of Therapy:  Heparin level 0.3-0.7 units/ml Monitor platelets by anticoagulation protocol: Yes   Plan:  Give 4000 units bolus x 1 Start heparin infusion at 1200 units/hr Check anti-Xa level in 6 hours and daily while on heparin Continue to monitor H&H and platelets  Shaheer, [place] D 09/21/2017,3:56 PM

## 2017-09-21 NOTE — ED Triage Notes (Signed)
Pt arrived via POV from home with reports of severe chest pain, back pain and arm pain off and one x 1 week.  Pt states the pain starts in his back and radiates to the chest and back of the arms. Pt states he does not get SOB, dizzy, lightheadedness. Pt states the pain was worse today. Pt states he has tried NSAIDs and BC's with some relief, but states the pain comes back.

## 2017-09-21 NOTE — Plan of Care (Signed)
  Progressing Education: Knowledge of General Education information will improve 09/21/2017 1957 - Progressing by Liliane Channel, RN Clinical Measurements: Cardiovascular complication will be avoided 09/21/2017 1957 - Progressing by Liliane Channel, RN Pain Managment: General experience of comfort will improve 09/21/2017 1957 - Progressing by Liliane Channel, RN Safety: Ability to remain free from injury will improve 09/21/2017 1957 - Progressing by Liliane Channel, RN

## 2017-09-21 NOTE — Progress Notes (Signed)
Pt BP was at 169/66. Notify Docotr Patel. Awaiting callback. Will continue to monitor.

## 2017-09-21 NOTE — H&P (Signed)
[place] [place] NAME: Randy Luna    MR#:  [id number]  DATE OF BIRTH:  Jan 11, 1961  DATE OF ADMISSION:  09/21/2017  PRIMARY CARE PHYSICIAN: Katheren Shams   REQUESTING/REFERRING PHYSICIAN: Conni Slipper MD  CHIEF COMPLAINT:   Chief Complaint  Patient presents with  . Chest Pain  . Back Pain  . Arm Pain    HISTORY OF PRESENT ILLNESS: Randy Luna  is a 57 y.o. male with a known history of poorly controlled diabetes type 2, essential hypertension and obesity presenting to the emergency room with complaint of having pain in his upper chest as well as arms.  Pain as a dull ache he also complains of back pain ongoing for the past 1 week.  He reports that his symptoms are not related to exercise.  He states that he gets the symptom at any time.  However sometimes they are worse after eating.  He denies any nausea vomiting or diarrhea.     PAST MEDICAL HISTORY:   Past Medical History:  Diagnosis Date  . Diabetes mellitus without complication ([place])   . Hypertension     PAST SURGICAL HISTORY:  Past Surgical History:  Procedure Laterality Date  . addenoids    . APPENDECTOMY    . brain tumor removed    . finger amputation repair      SOCIAL HISTORY:  Social History   Tobacco Use  . Smoking status: Never Smoker  . Smokeless tobacco: Never Used  Substance Use Topics  . Alcohol use: No    FAMILY HISTORY:  Family History  Problem Relation Age of Onset  . Deep vein thrombosis Father   . Diabetes Father   . Diabetes Mother     DRUG ALLERGIES: No Known Allergies  REVIEW OF SYSTEMS:   CONSTITUTIONAL: No fever, fatigue or weakness.  EYES: No blurred or double vision.  EARS, NOSE, AND THROAT: No tinnitus or ear pain.  RESPIRATORY: No cough, shortness of breath, wheezing or hemoptysis.  CARDIOVASCULAR: Positive chest pain, orthopnea, edema.  GASTROINTESTINAL: No nausea, vomiting, diarrhea or abdominal pain.   GENITOURINARY: No dysuria, hematuria.  ENDOCRINE: No polyuria, nocturia,  HEMATOLOGY: No anemia, easy bruising or bleeding SKIN: No rash or lesion. MUSCULOSKELETAL: No joint pain or arthritis.   NEUROLOGIC: No tingling, numbness, weakness.  PSYCHIATRY: No anxiety or depression.   MEDICATIONS AT HOME:  Prior to Admission medications   Medication Sig Start Date End Date Taking? Authorizing Provider  allopurinol (ZYLOPRIM) 100 MG tablet Take 1 tablet (100 mg total) by mouth daily. Patient taking differently: Take 100 mg by mouth 2 (two) times daily.  12/04/16  Yes Demetrios Loll, MD  carvedilol (COREG) 6.25 MG tablet Take 1 tablet by mouth 2 (two) times daily. 11/21/16  Yes [provider]  LANTUS SOLOSTAR 100 UNIT/ML Solostar Pen Inject 50 Units into the skin daily at 10 pm.  11/21/16  Yes [provider]  lisinopril-hydrochlorothiazide (PRINZIDE,ZESTORETIC) 20-12.5 MG tablet Take 1 tablet by mouth daily. 11/21/16  Yes [provider]  metFORMIN (GLUCOPHAGE) 1000 MG tablet Take 1 tablet by mouth 2 (two) times daily. 11/21/16  Yes [provider]  rosuvastatin (CRESTOR) 20 MG tablet Take 1 tablet by mouth daily. 11/21/16  Yes [provider]  colchicine 0.6 MG tablet Take 1 tablet (0.6 mg total) by mouth daily. Patient not taking: Reported on 09/21/2017 12/04/16   Demetrios Loll, MD      PHYSICAL EXAMINATION:   VITAL SIGNS:  Blood pressure (!) 175/114, pulse (!) 105, temperature 97.9 F (36.6 C), temperature source Oral, resp. rate 20, height 5\' 6"  (1.676 m), weight 235 lb (106.6 kg), SpO2 95 %.  GENERAL:  57 y.o.-year-old patient lying in the bed with no acute distress.  EYES: Pupils equal, round, reactive to light and accommodation. No scleral icterus. Extraocular muscles intact.  HEENT: Head atraumatic, normocephalic. Oropharynx and nasopharynx clear.  NECK:  Supple, no jugular venous distention. No thyroid enlargement, no tenderness.  LUNGS: Normal  breath sounds bilaterally, no wheezing, rales,rhonchi or crepitation. No use of accessory muscles of respiration.  CARDIOVASCULAR: S1, S2 normal. No murmurs, rubs, or gallops.  ABDOMEN: Soft, nontender, nondistended. Bowel sounds present. No organomegaly or mass.  EXTREMITIES: No pedal edema, cyanosis, or clubbing.  NEUROLOGIC: Cranial nerves II through XII are intact. Muscle strength 5/5 in all extremities. Sensation intact. Gait not checked.  PSYCHIATRIC: The patient is alert and oriented x 3.  SKIN: No obvious rash, lesion, or ulcer.   LABORATORY PANEL:   CBC Recent Labs  Lab 09/21/17 1409  WBC 10.1  HGB 13.6  HCT 41.2  PLT 406  MCV 84.5  MCH 28.0  MCHC 33.1  RDW 16.5*   ------------------------------------------------------------------------------------------------------------------  Chemistries  Recent Labs  Lab 09/21/17 1409  NA 134*  K 4.7  CL 102  CO2 19*  GLUCOSE 491*  BUN 17  CREATININE 1.27*  CALCIUM 9.0  AST 32  ALT 18  ALKPHOS 68  BILITOT 0.7   ------------------------------------------------------------------------------------------------------------------ estimated creatinine clearance is 74.3 mL/min (A) (by C-G formula based on SCr of 1.27 mg/dL (H)). ------------------------------------------------------------------------------------------------------------------ No results for input(s): TSH, T4TOTAL, T3FREE, THYROIDAB in the last 72 hours.  Invalid input(s): FREET3   Coagulation profile No results for input(s): INR, PROTIME in the last 168 hours. ------------------------------------------------------------------------------------------------------------------- No results for input(s): DDIMER in the last 72 hours. -------------------------------------------------------------------------------------------------------------------  Cardiac Enzymes Recent Labs  Lab 09/21/17 1409  TROPONINI 0.23*    ------------------------------------------------------------------------------------------------------------------ Invalid input(s): POCBNP  ---------------------------------------------------------------------------------------------------------------  Urinalysis    Component Value Date/Time   COLORURINE YELLOW (A) 12/02/2016 1728   APPEARANCEUR CLEAR (A) 12/02/2016 1728   LABSPEC 1.015 12/02/2016 1728   PHURINE 5.0 12/02/2016 1728   GLUCOSEU NEGATIVE 12/02/2016 1728   HGBUR NEGATIVE 12/02/2016 1728   BILIRUBINUR NEGATIVE 12/02/2016 1728   KETONESUR NEGATIVE 12/02/2016 1728   PROTEINUR NEGATIVE 12/02/2016 1728   NITRITE NEGATIVE 12/02/2016 1728   LEUKOCYTESUR NEGATIVE 12/02/2016 1728     RADIOLOGY: Dg Chest 2 View  Result Date: 09/21/2017 CLINICAL DATA:  Severe chest pain. EXAM: CHEST - 2 VIEW COMPARISON:  Chest x-ray dated Dec 02, 2016. FINDINGS: The heart size and mediastinal contours are within normal limits. Both lungs are clear. The visualized skeletal structures are unremarkable. IMPRESSION: No active cardiopulmonary disease. Electronically Signed   By: Titus Dubin M.D.   On: 09/21/2017 14:39    EKG: Orders placed or performed during the hospital encounter of 09/21/17  . ED EKG within 10 minutes  . ED EKG within 10 minutes    IMPRESSION AND PLAN: Patient is a 57 year old white male with history of diabetes type 2 and essential hypertension presenting to the emergency room with complaint of having chest pain  1.  Chest pain due non-ST MI Start heparin drip Start patient on metoprolol Lipitor Aspirin Cardiology consult  2.  Diabetes type 2 poor control Continue Lantus Placed on sliding scale insulin Check a hemoglobin A1c  3.  Accelerated hypertension I will start patient on metoprolol hold his  lisinopril HCTZ due to possible Will do hydralazine as needed and nitro patch  4.  Morbid obesity weight loss recommended  All the records are reviewed and  case discussed with ED provider. Management plans discussed with the patient, family and they are in agreement.  CODE STATUS:    Code Status Orders  (From admission, onward)        Start     Ordered   09/21/17 1541  Full code  Continuous     09/21/17 1541    Code Status History    Date Active Date Inactive Code Status Order ID Comments User Context   12/02/2016 19:21 12/04/2016 18:56 Full Code [id number]  Dustin Flock, MD Inpatient       TOTAL TIME TAKING CARE OF THIS PATIENT: 55 minutes.    Dustin Flock M.D on 09/21/2017 at 3:52 PM  Between 7am to 6pm - Pager - [phone]  After 6pm go to [email or web address] - Proofreader  [facility]  [phone]  CC: Primary care physician; Katheren Shams

## 2017-09-21 NOTE — ED Notes (Signed)
ED Provider at bedside. 

## 2017-09-21 NOTE — ED Provider Notes (Signed)
[facility] Emergency Department Provider Note   ____________________________________________   First MD Initiated Contact with Patient 09/21/17 1451     (approximate)  I have reviewed the triage vital signs and the nursing notes.   HISTORY  Chief Complaint Chest Pain; Back Pain; and Arm Pain    HPI Randy Luna is a 57 y.o. male Who reports pain beginning in the back going across the back of the shoulders and then around the front of the upper chest and down both arms. He says when he gets the elbows it stops. It is not brought on by any particular activity although sometimes it seems to come on after he eats. It has gotten better with aspirin and it has gotten better with antacids. It is not related to exertion patient reports he doesn't exercise but he can walk and climb stairs without any difficulty including pain or shortness of breath. Patient reports he had a recent history of gout but has not had any problems with a 10 week since she's been taking magnesium and cherry extract.patient is not nauseated short of breath when he gets the pain is not sweaty with the pain either   Past Medical History:  Diagnosis Date  . Diabetes mellitus without complication ([place])   . Hypertension     Patient Active Problem List   Diagnosis Date Noted  . Acute renal failure (ARF) ([place]) 12/02/2016    Past Surgical History:  Procedure Laterality Date  . addenoids    . APPENDECTOMY    . brain tumor removed    . finger amputation repair      Prior to Admission medications   Medication Sig Start Date End Date Taking? Authorizing Provider  allopurinol (ZYLOPRIM) 100 MG tablet Take 1 tablet (100 mg total) by mouth daily. 12/04/16   Demetrios Loll, MD  carvedilol (COREG) 6.25 MG tablet Take 1 tablet by mouth 2 (two) times daily. 11/21/16   [provider]  colchicine 0.6 MG tablet Take 1 tablet (0.6 mg total) by mouth daily. 12/04/16   Demetrios Loll, MD  glimepiride  (AMARYL) 4 MG tablet Take 1 tablet by mouth daily. 11/21/16   [provider]  LANTUS SOLOSTAR 100 UNIT/ML Solostar Pen Inject 40 Units into the skin daily. 11/21/16   [provider]  lisinopril-hydrochlorothiazide (PRINZIDE,ZESTORETIC) 20-12.5 MG tablet Take 1 tablet by mouth daily. 11/21/16   [provider]  metFORMIN (GLUCOPHAGE) 1000 MG tablet Take 1 tablet by mouth 2 (two) times daily. 11/21/16   [provider]  rosuvastatin (CRESTOR) 20 MG tablet Take 1 tablet by mouth daily. 11/21/16   [provider]    Allergies Patient has no known allergies.  Family History  Problem Relation Age of Onset  . Deep vein thrombosis Father   . Diabetes Father   . Diabetes Mother     Social History Social History   Tobacco Use  . Smoking status: Never Smoker  . Smokeless tobacco: Never Used  Substance Use Topics  . Alcohol use: No  . Drug use: No    Review of Systems  Constitutional: No fever/chills Eyes: No visual changes. ENT: No sore throat. Cardiovascular: see history of present illness Respiratory: Denies shortness of breath. Gastrointestinal: No abdominal pain.  No nausea, no vomiting.  No diarrhea.  No constipation. Genitourinary: Negative for dysuria. Musculoskeletal: Negative for back pain. Skin: Negative for rash. Neurological: Negative for headaches, focal weakness   ____________________________________________   PHYSICAL EXAM:  VITAL SIGNS: ED  Triage Vitals  Enc Vitals Group     BP 09/21/17 1411 (!) 175/114     Pulse Rate 09/21/17 1411 (!) 105     Resp 09/21/17 1411 20     Temp 09/21/17 1411 97.9 F (36.6 C)     Temp Source 09/21/17 1411 Oral     SpO2 09/21/17 1411 95 %     Weight 09/21/17 1412 235 lb (106.6 kg)     Height 09/21/17 1412 5\' 6"  (1.676 m)     Head Circumference --      Peak Flow --      Pain Score 09/21/17 1412 8     Pain Loc --      Pain Edu? --      Excl. in [place]? --     Constitutional: Alert  and oriented. Well appearing and in no acute distress. Eyes: Conjunctivae are normal. PERRL. EOMI. Head: Atraumatic. Nose: No congestion/rhinnorhea. Mouth/Throat: Mucous membranes are moist.  Oropharynx non-erythematous. Neck: No stridor.   Cardiovascular: Normal rate, regular rhythm. Grossly normal heart sounds.  Good peripheral circulation. Respiratory: Normal respiratory effort.  No retractions. Lungs CTAB. Gastrointestinal: Soft and nontender. No distention. No abdominal bruits. No CVA tenderness. Musculoskeletal: No lower extremity tenderness nor edema.  No joint effusions. Neurologic:  Normal speech and language. No gross focal neurologic deficits are appreciated. No gait instability. Skin:  Skin is warm, dry and intact. No rash noted. Psychiatric: Mood and affect are normal. Speech and behavior are normal.  ____________________________________________   LABS (all labs ordered are listed, but only abnormal results are displayed)  Labs Reviewed  BASIC METABOLIC PANEL - Abnormal; Notable for the following components:      Result Value   Sodium 134 (*)    CO2 19 (*)    Glucose, Bld 491 (*)    Creatinine, Ser 1.27 (*)    All other components within normal limits  CBC - Abnormal; Notable for the following components:   RDW 16.5 (*)    All other components within normal limits  TROPONIN I - Abnormal; Notable for the following components:   Troponin I 0.23 (*)    All other components within normal limits  HEPATIC FUNCTION PANEL   ____________________________________________  EKG  EKG read and interpreted by me shows sinus tachycardia rate 106 left axis no acute ST-T wave changes ____________________________________________  RADIOLOGY  ED MD interpretation:  No act  Official radiology report(s): Dg Chest 2 View  Result Date: 09/21/2017 CLINICAL DATA:  Severe chest pain. EXAM: CHEST - 2 VIEW COMPARISON:  Chest x-ray dated Dec 02, 2016. FINDINGS: The heart size and  mediastinal contours are within normal limits. Both lungs are clear. The visualized skeletal structures are unremarkable. IMPRESSION: No active cardiopulmonary disease. Electronically Signed   By: Titus Dubin M.D.   On: 09/21/2017 14:39    ____________________________________________   PROCEDURES  Procedure(s) performed:   Procedures  Critical Care performed:   ____________________________________________   INITIAL IMPRESSION / ASSESSMENT AND PLAN / ED COURSE  patient's troponin is elevated. EKG looks okay. Please need to suspect that his pain actually is heart disease related. We will admit him and work him up further.         ____________________________________________   FINAL CLINICAL IMPRESSION(S) / ED DIAGNOSES  Final diagnoses:  Chest pain due to myocardial ischemia, unspecified ischemic chest pain type  Elevated troponin     ED Discharge Orders    None       Note:  This  document was prepared using Systems analyst and may include unintentional dictation errors.    Nena Polio, MD 09/21/17 [phone]

## 2017-09-21 NOTE — ED Notes (Signed)
Patient transported to X-ray 

## 2017-09-22 ENCOUNTER — Encounter: Payer: Self-pay | Admitting: Cardiology

## 2017-09-22 ENCOUNTER — Encounter
Admission: EM | Disposition: A | Discharge status: Other Acute Inpatient Hospital | Payer: Self-pay | Source: Home / Self Care | Attending: Internal Medicine

## 2017-09-22 HISTORY — PX: LEFT HEART CATH AND CORONARY ANGIOGRAPHY: CATH118249

## 2017-09-22 LAB — CBC
HCT: 35.2 % — ABNORMAL LOW (ref 40.0–52.0)
HEMOGLOBIN: 12.3 g/dL — AB (ref 13.0–18.0)
MCH: 28.9 pg (ref 26.0–34.0)
MCHC: 35 g/dL (ref 32.0–36.0)
MCV: 82.8 fL (ref 80.0–100.0)
PLATELETS: 334 10*3/uL (ref 150–440)
RBC: 4.25 MIL/uL — ABNORMAL LOW (ref 4.40–5.90)
RDW: 16.6 % — ABNORMAL HIGH (ref 11.5–14.5)
WBC: 9.8 10*3/uL (ref 3.8–10.6)

## 2017-09-22 LAB — BASIC METABOLIC PANEL
Anion gap: 9 (ref 5–15)
BUN: 19 mg/dL (ref 6–20)
CHLORIDE: 104 mmol/L (ref 101–111)
CO2: 22 mmol/L (ref 22–32)
Calcium: 8.4 mg/dL — ABNORMAL LOW (ref 8.9–10.3)
Creatinine, Ser: 1.12 mg/dL (ref 0.61–1.24)
GFR calc Af Amer: >60 mL/min (ref >60)
GFR calc non Af Amer: >60 mL/min (ref >60)
Glucose, Bld: 268 mg/dL — ABNORMAL HIGH (ref 65–99)
Potassium: 4.2 mmol/L (ref 3.5–5.1)
SODIUM: 135 mmol/L (ref 135–145)

## 2017-09-22 LAB — TROPONIN I: Troponin I: 0.78 ng/mL (ref <0.03)

## 2017-09-22 LAB — HEPARIN LEVEL (UNFRACTIONATED)
Heparin Unfractionated: 0.41 IU/mL (ref 0.30–0.70)
Heparin Unfractionated: <0.1 IU/mL — ABNORMAL LOW (ref 0.30–0.70)
Heparin Unfractionated: 0.14 IU/mL — ABNORMAL LOW (ref 0.30–0.70)

## 2017-09-22 LAB — LIPID PANEL
CHOL/HDL RATIO: 6.6 ratio
CHOLESTEROL: 237 mg/dL — AB (ref 0–200)
HDL: 36 mg/dL — ABNORMAL LOW (ref >40)
LDL Cholesterol: 141 mg/dL — ABNORMAL HIGH (ref 0–99)
Triglycerides: 301 mg/dL — ABNORMAL HIGH (ref <150)
VLDL: 60 mg/dL — AB (ref 0–40)

## 2017-09-22 LAB — GLUCOSE, CAPILLARY
GLUCOSE-CAPILLARY: 327 mg/dL — AB (ref 65–99)
GLUCOSE-CAPILLARY: 287 mg/dL — AB (ref 65–99)

## 2017-09-22 SURGERY — LEFT HEART CATH AND CORONARY ANGIOGRAPHY
Anesthesia: Moderate Sedation

## 2017-09-22 MED ORDER — SODIUM CHLORIDE 0.9 % IV SOLN: 250.0000 mL | INTRAVENOUS | Status: DC | PRN

## 2017-09-22 MED ORDER — NITROGLYCERIN 2 % TD OINT: 2.0000 [in_us] | TOPICAL_OINTMENT | Freq: Four times a day (QID) | TRANSDERMAL | Status: DC

## 2017-09-22 MED ORDER — SODIUM CHLORIDE 0.9 % WEIGHT BASED INFUSION
1.0000 mL/kg/h | INTRAVENOUS | Status: DC
  Administered 2017-09-22: 1 mL/kg/h via INTRAVENOUS

## 2017-09-22 MED ORDER — SODIUM CHLORIDE 0.9 % WEIGHT BASED INFUSION: 1.0000 mL/kg/h | INTRAVENOUS | Status: DC

## 2017-09-22 MED ORDER — INSULIN GLARGINE 100 UNIT/ML ~~LOC~~ SOLN
54.0000 [IU] | Freq: Every day | SUBCUTANEOUS | Status: DC
  Filled 2017-09-22: qty 0.54

## 2017-09-22 MED ORDER — NITROGLYCERIN 2 % TD OINT
TOPICAL_OINTMENT | TRANSDERMAL | Status: AC
  Administered 2017-09-22: 2 [in_us] via TOPICAL
  Filled 2017-09-22: qty 1

## 2017-09-22 MED ORDER — HEPARIN (PORCINE) IN NACL 100-0.45 UNIT/ML-% IJ SOLN
1400.0000 [IU]/h | INTRAMUSCULAR | Status: DC
  Administered 2017-09-22: 1400 [IU]/h via INTRAVENOUS
  Filled 2017-09-22: qty 250

## 2017-09-22 MED ORDER — MIDAZOLAM HCL 2 MG/2ML IJ SOLN
INTRAMUSCULAR | Status: AC
Start: 2017-09-22 — End: 2017-09-22
  Filled 2017-09-22: qty 2

## 2017-09-22 MED ORDER — HEPARIN (PORCINE) IN NACL 100-0.45 UNIT/ML-% IJ SOLN
1650.0000 [IU]/h | INTRAMUSCULAR | Status: DC
  Administered 2017-09-22: 1650 [IU]/h via INTRAVENOUS

## 2017-09-22 MED ORDER — LIVING WELL WITH DIABETES BOOK
Freq: Once | Status: DC
  Filled 2017-09-22: qty 1

## 2017-09-22 MED ORDER — ONDANSETRON HCL 4 MG/2ML IJ SOLN: 4.0000 mg | Freq: Four times a day (QID) | INTRAMUSCULAR | Status: DC | PRN

## 2017-09-22 MED ORDER — HEPARIN (PORCINE) IN NACL 2-0.9 UNIT/ML-% IJ SOLN
INTRAMUSCULAR | Status: AC
  Filled 2017-09-22: qty 1000

## 2017-09-22 MED ORDER — HEPARIN (PORCINE) IN NACL 100-0.45 UNIT/ML-% IJ SOLN: 1400.0000 [IU]/h | INTRAMUSCULAR | Status: DC

## 2017-09-22 MED ORDER — ASPIRIN 81 MG PO CHEW
81.0000 mg | CHEWABLE_TABLET | ORAL | Status: AC
  Administered 2017-09-22: 81 mg via ORAL
  Filled 2017-09-22: qty 1

## 2017-09-22 MED ORDER — ALLOPURINOL 100 MG PO TABS: 100.0000 mg | ORAL_TABLET | Freq: Every day | ORAL | 0 refills | Status: DC

## 2017-09-22 MED ORDER — SODIUM CHLORIDE 0.9% FLUSH: 3.0000 mL | INTRAVENOUS | Status: DC | PRN

## 2017-09-22 MED ORDER — FENTANYL CITRATE (PF) 100 MCG/2ML IJ SOLN
INTRAMUSCULAR | Status: DC | PRN
  Administered 2017-09-22: 25 ug via INTRAVENOUS

## 2017-09-22 MED ORDER — NITROGLYCERIN 2 % TD OINT
2.0000 [in_us] | TOPICAL_OINTMENT | Freq: Once | TRANSDERMAL | Status: AC
  Administered 2017-09-22: 2 [in_us] via TOPICAL

## 2017-09-22 MED ORDER — SODIUM CHLORIDE 0.9 % WEIGHT BASED INFUSION
3.0000 mL/kg/h | INTRAVENOUS | Status: AC
  Administered 2017-09-22: 3 mL/kg/h via INTRAVENOUS

## 2017-09-22 MED ORDER — NITROGLYCERIN 2 % TD OINT: 0.5000 [in_us] | TOPICAL_OINTMENT | Freq: Four times a day (QID) | TRANSDERMAL | 0 refills | Status: AC

## 2017-09-22 MED ORDER — MIDAZOLAM HCL 2 MG/2ML IJ SOLN
INTRAMUSCULAR | Status: DC | PRN
  Administered 2017-09-22: 1 mg via INTRAVENOUS

## 2017-09-22 MED ORDER — SODIUM CHLORIDE 0.9 % WEIGHT BASED INFUSION
1.0000 mL/kg/h | INTRAVENOUS | Status: AC
  Administered 2017-09-22 (×2): 1 mL/kg/h via INTRAVENOUS

## 2017-09-22 MED ORDER — INSULIN ASPART 100 UNIT/ML ~~LOC~~ SOLN
0.0000 [IU] | Freq: Three times a day (TID) | SUBCUTANEOUS | Status: DC
  Administered 2017-09-22: 11 [IU] via SUBCUTANEOUS
  Administered 2017-09-22: 8 [IU] via SUBCUTANEOUS
  Filled 2017-09-22: qty 1

## 2017-09-22 MED ORDER — HEPARIN BOLUS VIA INFUSION
2600.0000 [IU] | Freq: Once | INTRAVENOUS | Status: AC
  Administered 2017-09-22: 2600 [IU] via INTRAVENOUS
  Filled 2017-09-22: qty 2600

## 2017-09-22 MED ORDER — SODIUM CHLORIDE 0.9% FLUSH
3.0000 mL | Freq: Two times a day (BID) | INTRAVENOUS | Status: DC
  Administered 2017-09-22: 3 mL via INTRAVENOUS

## 2017-09-22 MED ORDER — FENTANYL CITRATE (PF) 100 MCG/2ML IJ SOLN
INTRAMUSCULAR | Status: AC
  Filled 2017-09-22: qty 2

## 2017-09-22 MED ORDER — NITROGLYCERIN 2 % TD OINT
0.5000 [in_us] | TOPICAL_OINTMENT | Freq: Four times a day (QID) | TRANSDERMAL | Status: DC
  Administered 2017-09-22 (×2): 0.5 [in_us] via TOPICAL
  Filled 2017-09-22 (×2): qty 1

## 2017-09-22 MED ORDER — ACETAMINOPHEN 325 MG PO TABS: 650.0000 mg | ORAL_TABLET | ORAL | Status: DC | PRN

## 2017-09-22 MED ORDER — ASPIRIN 81 MG PO CHEW: 81.0000 mg | CHEWABLE_TABLET | ORAL | Status: DC

## 2017-09-22 MED ORDER — IOPAMIDOL (ISOVUE-300) INJECTION 61%
INTRAVENOUS | Status: DC | PRN
  Administered 2017-09-22: 95 mL via INTRA_ARTERIAL

## 2017-09-22 MED ORDER — SODIUM CHLORIDE 0.9 % WEIGHT BASED INFUSION: 3.0000 mL/kg/h | INTRAVENOUS | Status: DC

## 2017-09-22 SURGICAL SUPPLY — 9 items
CATH INFINITI 5FR ANG PIGTAIL (CATHETERS) ×3 IMPLANT
CATH INFINITI 5FR JL4 (CATHETERS) ×3 IMPLANT
CATH INFINITI JR4 5F (CATHETERS) ×3 IMPLANT
DEVICE CLOSURE MYNXGRIP 5F (Vascular Products) ×3 IMPLANT
KIT MANI 3VAL PERCEP (MISCELLANEOUS) ×3 IMPLANT
NEEDLE PERC 18GX7CM (NEEDLE) ×3 IMPLANT
PACK CARDIAC CATH (CUSTOM PROCEDURE TRAY) ×3 IMPLANT
SHEATH AVANTI 5FR X 11CM (SHEATH) ×6 IMPLANT
WIRE GUIDERIGHT .035X150 (WIRE) ×3 IMPLANT

## 2017-09-22 NOTE — Progress Notes (Signed)
Patient is being transport to [facility] for evaluation for CABG. He is going to room 7103 report is called and talked to Judson Roch, [place]. [phone]) Patient is being transport by Rob Hickman [facility] [phone]) and ETA was around [place]. Patient is on a Heparin drip at 14 ml/hr and post cath NS IV fluid at 106 ml/hr. Admitting doctor is Dr. Chanetta Marshall. Updated patient and wife about the transport. RN will continue to monitor.

## 2017-09-22 NOTE — Progress Notes (Signed)
[facility] called and talked to sharon brown about pt before pre-cath.

## 2017-09-22 NOTE — Consult Note (Signed)
Cardiology Consultation Note    Patient ID: Randy Luna, MRN: [id number], DOB/AGE: Oct 19, 1960 57 y.o. Admit date: 09/21/2017   Date of Consult: 09/22/2017 Primary Physician: Katheren Shams Primary Cardiologist:   Chief Complaint: chest and back pain Reason for Consultation: abnormal troponin Requesting MD: Dr. Posey Pronto  HPI: Randy Luna is a 57 y.o. male with history of 57 year old male with history of diabetes, hypertension who was admitted after approximately 1 week of nonexertional bilateral shoulder and upper back pain with radiation down the inside of his right and left arms.  The symptoms have gradually increased over the past week.  He now has chest pain and back pain which is intermittent.  This does not worsen with exercise.  He denies tobacco use.  He denies symptoms like this in the past.  He denies any sustained rapid irregular heartbeat.  He is currently pain-free.  His initial serum troponin was 0.23 and is increased to 0.78.  Triglycerides are 301, total cholesterol 237 with an LDL of 141.  Patient has mild chronic kidney disease with a serum creatinine this morning of 1.12.  This appears to be near his baseline.  Past Medical History:  Diagnosis Date  . Diabetes mellitus without complication ([place])   . Hypertension       Surgical History:  Past Surgical History:  Procedure Laterality Date  . addenoids    . APPENDECTOMY    . brain tumor removed    . finger amputation repair       Home Meds: Prior to Admission medications   Medication Sig Start Date End Date Taking? Authorizing Provider  allopurinol (ZYLOPRIM) 100 MG tablet Take 1 tablet (100 mg total) by mouth daily. Patient taking differently: Take 100 mg by mouth 2 (two) times daily.  12/04/16  Yes Demetrios Loll, MD  carvedilol (COREG) 6.25 MG tablet Take 1 tablet by mouth 2 (two) times daily. 11/21/16  Yes [provider]  LANTUS SOLOSTAR 100 UNIT/ML Solostar Pen Inject 50 Units into the skin daily  at 10 pm.  11/21/16  Yes [provider]  lisinopril-hydrochlorothiazide (PRINZIDE,ZESTORETIC) 20-12.5 MG tablet Take 1 tablet by mouth daily. 11/21/16  Yes [provider]  metFORMIN (GLUCOPHAGE) 1000 MG tablet Take 1 tablet by mouth 2 (two) times daily. 11/21/16  Yes [provider]  rosuvastatin (CRESTOR) 20 MG tablet Take 1 tablet by mouth daily. 11/21/16  Yes [provider]  colchicine 0.6 MG tablet Take 1 tablet (0.6 mg total) by mouth daily. Patient not taking: Reported on 09/21/2017 12/04/16   Demetrios Loll, MD    Inpatient Medications:  . allopurinol  100 mg Oral Daily  . aspirin EC  325 mg Oral Daily  . atorvastatin  80 mg Oral q1800  . carvedilol  12.5 mg Oral BID  . colchicine  0.6 mg Oral Daily  . insulin glargine  50 Units Subcutaneous Q2200  . nitroGLYCERIN  0.5 inch Topical Q6H   . sodium chloride 75 mL/hr at 09/21/17 1738  . heparin 1,400 Units/hr (09/22/17 0016)    Allergies: No Known Allergies  Social History   Socioeconomic History  . Marital status: Married    Spouse name: Not on file  . Number of children: Not on file  . Years of education: Not on file  . Highest education level: Not on file  Social Needs  . Financial resource strain: Not on file  . Food insecurity - worry: Not on file  . Food insecurity - inability: Not  on file  . Transportation needs - medical: Not on file  . Transportation needs - non-medical: Not on file  Occupational History  . Not on file  Tobacco Use  . Smoking status: Never Smoker  . Smokeless tobacco: Never Used  Substance and Sexual Activity  . Alcohol use: No  . Drug use: No  . Sexual activity: No  Other Topics Concern  . Not on file  Social History Narrative  . Not on file     Family History  Problem Relation Age of Onset  . Deep vein thrombosis Father   . Diabetes Father   . Diabetes Mother      Review of Systems: A 12-system review of systems was performed and is negative  except as noted in the HPI.  Labs: Recent Labs    09/21/17 1409 09/21/17 1848 09/22/17 0130  TROPONINI 0.23* 0.47* 0.78*   Lab Results  Component Value Date   WBC 9.8 09/22/2017   HGB 12.3 (L) 09/22/2017   HCT 35.2 (L) 09/22/2017   MCV 82.8 09/22/2017   PLT 334 09/22/2017    Recent Labs  Lab 09/21/17 1409 09/22/17 0430  NA 134* 135  K 4.7 4.2  CL 102 104  CO2 19* 22  BUN 17 19  CREATININE 1.27* 1.12  CALCIUM 9.0 8.4*  PROT 7.2  --   BILITOT 0.7  --   ALKPHOS 68  --   ALT 18  --   AST 32  --   GLUCOSE 491* 268*   Lab Results  Component Value Date   CHOL 237 (H) 09/22/2017   HDL 36 (L) 09/22/2017   LDLCALC 141 (H) 09/22/2017   TRIG 301 (H) 09/22/2017   No results found for: DDIMER  Radiology/Studies:  Dg Chest 2 View  Result Date: 09/21/2017 CLINICAL DATA:  Severe chest pain. EXAM: CHEST - 2 VIEW COMPARISON:  Chest x-ray dated Dec 02, 2016. FINDINGS: The heart size and mediastinal contours are within normal limits. Both lungs are clear. The visualized skeletal structures are unremarkable. IMPRESSION: No active cardiopulmonary disease. Electronically Signed   By: Titus Dubin M.D.   On: 09/21/2017 14:39    Wt Readings from Last 3 Encounters:  09/21/17 106.6 kg (235 lb)  12/02/16 101.8 kg (224 lb 8 oz)    EKG: Sinus rhythm with no ischemia  Physical Exam:  Blood pressure 122/85, pulse 79, temperature 97.6 F (36.4 C), temperature source Oral, resp. rate 18, height 5\' 6"  (1.676 m), weight 106.6 kg (235 lb), SpO2 96 %. Body mass index is 37.93 kg/m. General: Well developed, well nourished, in no acute distress. Head: Normocephalic, atraumatic, sclera non-icteric, no xanthomas, nares are without discharge.  Neck: Negative for carotid bruits. JVD not elevated. Lungs: Clear bilaterally to auscultation without wheezes, rales, or rhonchi. Breathing is unlabored. Heart: RRR with S1 S2. No murmurs, rubs, or gallops appreciated. Abdomen: Soft, non-tender,  non-distended with normoactive bowel sounds. No hepatomegaly. No rebound/guarding. No obvious abdominal masses. Msk:  Strength and tone appear normal for age. Extremities: No clubbing or cyanosis. No edema.  Distal pedal pulses are 2+ and equal bilaterally. Neuro: Alert and oriented X 3. No facial asymmetry. No focal deficit. Moves all extremities spontaneously. Psych:  Responds to questions appropriately with a normal affect.     Assessment and Plan  57 year old male with history of diabetes and hypertension with no prior cardiac history presenting with chest pain with atypical features however has ruled in for non-ST elevation myocardial infarction by cardiac  markers.  There is no pericardial rub.  His electrocardiogram shows no ischemia.  His pain is resolved.  His serum troponin is increased to 0.78.  We will need to evaluate for evidence of coronary artery disease to guide further therapy.  We will also proceed with an echocardiogram to evaluate his pericardium.  Patient was placed on aspirin, beta-blockers, IV heparin.  At this until cath is completed.  He is hemodynamically stable at present.  Risk and benefits of catheter explained to the patient and wife and they agree to proceed.  Signed, Teodoro Spray MD 09/22/2017, 7:30 AM Pager: [phone]) [phone]

## 2017-09-22 NOTE — Progress Notes (Signed)
ANTICOAGULATION CONSULT NOTE  Pharmacy Consult for Heparin Indication: chest pain/ACS  No Known Allergies  Patient Measurements: Height: 5\' 6"  (167.6 cm) Weight: 234 lb 12.8 oz (106.5 kg) IBW/kg (Calculated) : 63.8 Heparin Dosing Weight: 88 kg   Vital Signs: Temp: 98.1 F (36.7 C) (03/11 1750) Temp Source: Oral (03/11 1750) BP: 167/98 (03/11 1750) Pulse Rate: 87 (03/11 1750)  Labs: Recent Labs    09/21/17 1409 09/21/17 1440 09/21/17 1848  09/22/17 0130 09/22/17 0430 09/22/17 1131 09/22/17 1817  HGB 13.6  --   --   --   --  12.3*  --   --   HCT 41.2  --   --   --   --  35.2*  --   --   PLT 406  --   --   --   --  334  --   --   APTT  --  30  --   --   --   --   --   --   LABPROT  --  11.8  --   --   --   --   --   --   INR  --  0.87  --   --   --   --   --   --   HEPARINUNFRC  --   --   --    < >  --  0.41 <0.10* 0.14*  CREATININE 1.27*  --   --   --   --  1.12  --   --   TROPONINI 0.23*  --  0.47*  --  0.78*  --   --   --    < > = values in this interval not displayed.    Estimated Creatinine Clearance: 84.3 mL/min (by C-G formula based on SCr of 1.12 mg/dL).   Medical History: Past Medical History:  Diagnosis Date  . Diabetes mellitus without complication ([place])   . Hypertension    Assessment: 57 y/o M admitted with chest pain not on anticoagulants PTA.   Goal of Therapy:  Heparin level 0.3-0.7 units/ml Monitor platelets by anticoagulation protocol: Yes   Plan:  HL = 0.14 is subtherapeutic on infusion rate of 1400 units/hr. Confirmed with RN that heparin has been infusing with no issues. Plan is for transfer to [facility].  Will order heparin bolus of 2600 units and increase infusion rate to 1650 units/hr. 6 hour HL ordered. CBC ordered with AM labs tomorrow.  Lenis Noon, PharmD, BCPS Clinical Pharmacist 09/22/2017,7:22 PM

## 2017-09-22 NOTE — Discharge Summary (Signed)
[place] [facility] [place] NAME: Randy Luna    MR#:  [id number]  DATE OF BIRTH:  [birth year]  DATE OF ADMISSION:  09/21/2017 ADMITTING PHYSICIAN: Dustin Flock, MD  DATE OF DISCHARGE: 09/22/2017  PRIMARY CARE PHYSICIAN: [facility], [facility]    ADMISSION DIAGNOSIS:  Elevated troponin [R74.8] Chest pain due to myocardial ischemia, unspecified ischemic chest pain type [I25.9]  DISCHARGE DIAGNOSIS:  Active Problems:   Chest pain   SECONDARY DIAGNOSIS:   Past Medical History:  Diagnosis Date  . Diabetes mellitus without complication ([place])   . Hypertension     HOSPITAL COURSE:   57 year old male with diabetes and essential hypertension who presented chest pain.  1. Non-STEMI: patient underwent cardiac catheterization showing  Ost 2nd Mrg to 2nd Mrg lesion is 99% stenosed.  Prox Cx-1 lesion is 90% stenosed.  Prox Cx-2 lesion is 95% stenosed.  Ost 1st Diag to 1st Diag lesion is 90% stenosed.  Prox LAD to Mid LAD lesion is 75% stenosed.  Prox LAD lesion is 70% stenosed.  Dist RCA lesion is 75% stenosed.  RPDA lesion is 75% stenosed.  Post Atrio lesion is 75% stenosed.  Mid RCA lesion is 55% stenosed.  The left ventricular systolic function is normal.  LV end diastolic pressure is normal.  There is no aortic valve stenosis.  There is no mitral valve stenosis and no mitral valve prolapse evident.   3 vessel cad LV preserved Continue heparin drip Continue aspirin, statin, Coreg   Patient needs referral for CABG   2. Poorly controlled Diabetes: Continue sliding scale with Lantus and ADA diet HgA1c 10.6  Patient needs close outpatient follow-up.  3. Hyperlipidemia: LDL is 141 which is not at goal. Goal would be ideally less than 70 Continue statin and patient will need repeat lipid panel test in 6 weeks.  4. Essential hypertension: Continue Coreg      DISCHARGE CONDITIONS AND DIET:   Stable cardiac  diet  CONSULTS OBTAINED:  Treatment Team:  Teodoro Spray, MD  DRUG ALLERGIES:  No Known Allergies  DISCHARGE MEDICATIONS:   Allergies as of 09/22/2017   No Known Allergies     Medication List    STOP taking these medications   colchicine 0.6 MG tablet   metFORMIN 1000 MG tablet Commonly known as:  GLUCOPHAGE   rosuvastatin 20 MG tablet Commonly known as:  CRESTOR     TAKE these medications   allopurinol 100 MG tablet Commonly known as:  ZYLOPRIM Take 1 tablet (100 mg total) by mouth daily. What changed:  when to take this   carvedilol 6.25 MG tablet Commonly known as:  COREG Take 1 tablet by mouth 2 (two) times daily.   heparin 100-0.45 UNIT/ML-% infusion Inject 1,400 Units/hr into the vein continuous.   LANTUS SOLOSTAR 100 UNIT/ML Solostar Pen Generic drug:  Insulin Glargine Inject 50 Units into the skin daily at 10 pm.   lisinopril-hydrochlorothiazide 20-12.5 MG tablet Commonly known as:  PRINZIDE,ZESTORETIC Take 1 tablet by mouth daily.   nitroGLYCERIN 2 % ointment Commonly known as:  NITROGLYN Apply 0.5 inches topically every 6 (six) hours.         Today   CHIEF COMPLAINT:   No chest pain overnight   VITAL SIGNS:  Blood pressure (!) 152/89, pulse 75, temperature 97.8 F (36.6 C), resp. rate (!) 21, height 5\' 6"  (1.676 m), weight 106.5 kg (234 lb 12.8 oz), SpO2 98 %.   REVIEW OF SYSTEMS:  Review of Systems  Constitutional: Negative.  Negative for chills, fever and malaise/fatigue.  HENT: Negative.  Negative for ear discharge, ear pain, hearing loss, nosebleeds and sore throat.   Eyes: Negative.  Negative for blurred vision and pain.  Respiratory: Negative.  Negative for cough, hemoptysis, shortness of breath and wheezing.   Cardiovascular: Negative.  Negative for chest pain, palpitations and leg swelling.  Gastrointestinal: Negative.  Negative for abdominal pain, blood in stool, diarrhea, nausea and vomiting.  Genitourinary: Negative.   Negative for dysuria.  Musculoskeletal: Negative.  Negative for back pain.  Skin: Negative.   Neurological: Negative for dizziness, tremors, speech change, focal weakness, seizures and headaches.  Endo/Heme/Allergies: Negative.  Does not bruise/bleed easily.  Psychiatric/Behavioral: Negative.  Negative for depression, hallucinations and suicidal ideas.     PHYSICAL EXAMINATION:  GENERAL:  57 y.o.-year-old patient lying in the bed with no acute distress.  NECK:  Supple, no jugular venous distention. No thyroid enlargement, no tenderness.  LUNGS: Normal breath sounds bilaterally, no wheezing, rales,rhonchi  No use of accessory muscles of respiration.  CARDIOVASCULAR: S1, S2 normal. No murmurs, rubs, or gallops.  ABDOMEN: Soft, non-tender, non-distended. Bowel sounds present. No organomegaly or mass.  EXTREMITIES: No pedal edema, cyanosis, or clubbing.  PSYCHIATRIC: The patient is alert and oriented x 3.  SKIN: No obvious rash, lesion, or ulcer.   DATA REVIEW:   CBC Recent Labs  Lab 09/22/17 0430  WBC 9.8  HGB 12.3*  HCT 35.2*  PLT 334    Chemistries  Recent Labs  Lab 09/21/17 1409 09/22/17 0430  NA 134* 135  K 4.7 4.2  CL 102 104  CO2 19* 22  GLUCOSE 491* 268*  BUN 17 19  CREATININE 1.27* 1.12  CALCIUM 9.0 8.4*  AST 32  --   ALT 18  --   ALKPHOS 68  --   BILITOT 0.7  --     Cardiac Enzymes Recent Labs  Lab 09/21/17 1409 09/21/17 1848 09/22/17 0130  TROPONINI 0.23* 0.47* 0.78*    Microbiology Results  @[email or web address] @  RADIOLOGY:  Dg Chest 2 View  Result Date: 09/21/2017 CLINICAL DATA:  Severe chest pain. EXAM: CHEST - 2 VIEW COMPARISON:  Chest x-ray dated Dec 02, 2016. FINDINGS: The heart size and mediastinal contours are within normal limits. Both lungs are clear. The visualized skeletal structures are unremarkable. IMPRESSION: No active cardiopulmonary disease. Electronically Signed   By: Titus Dubin M.D.   On: 09/21/2017 14:39      Allergies as  of 09/22/2017   No Known Allergies     Medication List    STOP taking these medications   colchicine 0.6 MG tablet   metFORMIN 1000 MG tablet Commonly known as:  GLUCOPHAGE   rosuvastatin 20 MG tablet Commonly known as:  CRESTOR     TAKE these medications   allopurinol 100 MG tablet Commonly known as:  ZYLOPRIM Take 1 tablet (100 mg total) by mouth daily. What changed:  when to take this   carvedilol 6.25 MG tablet Commonly known as:  COREG Take 1 tablet by mouth 2 (two) times daily.   heparin 100-0.45 UNIT/ML-% infusion Inject 1,400 Units/hr into the vein continuous.   LANTUS SOLOSTAR 100 UNIT/ML Solostar Pen Generic drug:  Insulin Glargine Inject 50 Units into the skin daily at 10 pm.   lisinopril-hydrochlorothiazide 20-12.5 MG tablet Commonly known as:  PRINZIDE,ZESTORETIC Take 1 tablet by mouth daily.   nitroGLYCERIN 2 % ointment Commonly known as:  NITROGLYN Apply 0.5 inches topically  every 6 (six) hours.          Management plans discussed with the patient and he is in agreement. Stable for discharge   Patient should follow up with cardiothoracic surgery  CODE STATUS:     Code Status Orders  (From admission, onward)        Start     Ordered   09/22/17 1028  Full code  Continuous     09/22/17 1027    Code Status History    Date Active Date Inactive Code Status Order ID Comments User Context   09/21/2017 15:42 09/22/2017 10:27 Full Code [id number]  Dustin Flock, MD ED   12/02/2016 19:21 12/04/2016 18:56 Full Code [id number]  Dustin Flock, MD Inpatient      TOTAL TIME TAKING CARE OF THIS PATIENT: 38 minutes.    Note: This dictation was prepared with Dragon dictation along with smaller phrase technology. Any transcriptional errors that result from this process are unintentional.  Melik Blancett M.D on 09/22/2017 at 12:09 PM  Between 7am to 6pm - Pager - [phone] After 6pm go to [email or web address] - password [email or web address]  Office  [phone]  CC: Primary care physician; Katheren Shams

## 2017-09-22 NOTE — Progress Notes (Signed)
ANTICOAGULATION CONSULT NOTE  Pharmacy Consult for Heparin Indication: chest pain/ACS  No Known Allergies  Patient Measurements: Height: 5\' 6"  (167.6 cm) Weight: 234 lb 12.8 oz (106.5 kg) IBW/kg (Calculated) : 63.8 Heparin Dosing Weight: 88 kg  Vital Signs: Temp: 97.8 F (36.6 C) (03/11 0845) Temp Source: Oral (03/11 0733) BP: 152/89 (03/11 1100) Pulse Rate: 75 (03/11 1100)  Labs: Recent Labs    09/21/17 1409 09/21/17 1440 09/21/17 1848 09/21/17 2200 09/22/17 0130 09/22/17 0430  HGB 13.6  --   --   --   --  12.3*  HCT 41.2  --   --   --   --  35.2*  PLT 406  --   --   --   --  334  APTT  --  30  --   --   --   --   LABPROT  --  11.8  --   --   --   --   INR  --  0.87  --   --   --   --   HEPARINUNFRC  --   --   --  0.20*  --  0.41  CREATININE 1.27*  --   --   --   --  1.12  TROPONINI 0.23*  --  0.47*  --  0.78*  --     Estimated Creatinine Clearance: 84.3 mL/min (by C-G formula based on SCr of 1.12 mg/dL).   Medical History: Past Medical History:  Diagnosis Date  . Diabetes mellitus without complication ([place])   . Hypertension    Assessment: 57 y/o M admitted with chest pain not on anticoagulants PTA.   Goal of Therapy:  Heparin level 0.3-0.7 units/ml Monitor platelets by anticoagulation protocol: Yes   Plan:  Give 4000 units bolus x 1 Start heparin infusion at 1200 units/hr Check anti-Xa level in 6 hours and daily while on heparin Continue to monitor H&H and platelets  3/10 PM heparin level 0.20. 1300 unit bolus and increase rate to 1400 units/hr. Recheck with AM labs.  3/11 AM heparin level 0.41. Continue current regimen. Recheck in 6 hours to confirm.   3/11@1200  Restarting heparin gtt after cath lab at 1400units/hr, checking level in 6 hours.  Donna Christen Elai Vanwyk 09/22/2017,12:01 PM

## 2017-09-22 NOTE — Progress Notes (Signed)
Talked to Dr. Posey Pronto, [facility] about patient's BP of 164/94 order to give oral Coreg now. Patient also takes Lisinopril at home, per MD he will check his creatinine and will restart if needed since patient had a cardiac cath today. RN will continue to monitor.

## 2017-09-22 NOTE — Progress Notes (Signed)
Report received from the Cath lab RN Rosalie Doctor, RN) Post Cath and given to primary RN, Roma Schanz, RN

## 2017-09-22 NOTE — Progress Notes (Signed)
Cath Lab called pharmacy to state MD wants Heparin restarted at 1200 today (09/22/17) Prudy Feeler, [place] 09/22/2017 1104

## 2017-09-22 NOTE — Progress Notes (Signed)
[place] at [place] NAME: Randy Luna    MR#:  [id number]  DATE OF BIRTH:  [birth year]  SUBJECTIVE:   patient with no chest pain overnight  REVIEW OF SYSTEMS:    Review of Systems  Constitutional: Negative for fever, chills weight loss HENT: Negative for ear pain, nosebleeds, congestion, facial swelling, rhinorrhea, neck pain, neck stiffness and ear discharge.   Respiratory: Negative for cough, shortness of breath, wheezing  Cardiovascular: Negative for chest pain, palpitations and leg swelling.  Gastrointestinal: Negative for heartburn, abdominal pain, vomiting, diarrhea or consitpation Genitourinary: Negative for dysuria, urgency, frequency, hematuria Musculoskeletal: Negative for back pain or joint pain Neurological: Negative for dizziness, seizures, syncope, focal weakness,  numbness and headaches.  Hematological: Does not bruise/bleed easily.  Psychiatric/Behavioral: Negative for hallucinations, confusion, dysphoric mood    Tolerating Diet: NPO      DRUG ALLERGIES:  No Known Allergies  VITALS:  Blood pressure (!) 148/92, pulse 74, temperature 97.7 F (36.5 C), temperature source Oral, resp. rate 18, height 5\' 6"  (1.676 m), weight 106.5 kg (234 lb 12.8 oz), SpO2 99 %.  PHYSICAL EXAMINATION:  Constitutional: Appears well-developed and well-nourished. No distress. HENT: Normocephalic. Marland Kitchen Oropharynx is clear and moist.  Eyes: Conjunctivae and EOM are normal. PERRLA, no scleral icterus.  Neck: Normal ROM. Neck supple. No JVD. No tracheal deviation. CVS: RRR, S1/S2 +, no murmurs, no gallops, no carotid bruit.  Pulmonary: Effort and breath sounds normal, no stridor, rhonchi, wheezes, rales.  Abdominal: Soft. BS +,  no distension, tenderness, rebound or guarding.  Musculoskeletal: Normal range of motion. No edema and no tenderness.  Neuro: Alert. CN 2-12 grossly intact. No focal deficits. Skin: Skin is warm and dry. No rash  noted. Psychiatric: Normal mood and affect.      LABORATORY PANEL:   CBC Recent Labs  Lab 09/22/17 0430  WBC 9.8  HGB 12.3*  HCT 35.2*  PLT 334   ------------------------------------------------------------------------------------------------------------------  Chemistries  Recent Labs  Lab 09/21/17 1409 09/22/17 0430  NA 134* 135  K 4.7 4.2  CL 102 104  CO2 19* 22  GLUCOSE 491* 268*  BUN 17 19  CREATININE 1.27* 1.12  CALCIUM 9.0 8.4*  AST 32  --   ALT 18  --   ALKPHOS 68  --   BILITOT 0.7  --    ------------------------------------------------------------------------------------------------------------------  Cardiac Enzymes Recent Labs  Lab 09/21/17 1409 09/21/17 1848 09/22/17 0130  TROPONINI 0.23* 0.47* 0.78*   ------------------------------------------------------------------------------------------------------------------  RADIOLOGY:  Dg Chest 2 View  Result Date: 09/21/2017 CLINICAL DATA:  Severe chest pain. EXAM: CHEST - 2 VIEW COMPARISON:  Chest x-ray dated Dec 02, 2016. FINDINGS: The heart size and mediastinal contours are within normal limits. Both lungs are clear. The visualized skeletal structures are unremarkable. IMPRESSION: No active cardiopulmonary disease. Electronically Signed   By: Titus Dubin M.D.   On: 09/21/2017 14:39     ASSESSMENT AND PLAN:     57 year old male with diabetes and essential hypertension who presented chest pain.  1. Non-STEMI: Patient is plan for cardiac catheter patient today Continue heparin drip Continue aspirin, statin, Coreg  2. Poorly controlled Diabetes: Continue sliding scale with Lantus and ADA diet HgA1c 10.6  Patient needs close outpatient follow-up.  3. Hyperlipidemia: LDL is 141 which is not at goal. Goal would be ideally less than 70 Continue statin and patient will need repeat lipid panel test in 6 weeks.  4. Essential hypertension: Continue Coreg  D/w dr Ubaldo Glassing  Management plans  discussed with the patient and he is in agreement.  CODE STATUS: full  TOTAL TIME TAKING CARE OF THIS PATIENT: 30 minutes.     POSSIBLE D/C tomorrow, DEPENDING ON CLINICAL CONDITION.   Torrance Stockley M.D on 09/22/2017 at 8:38 AM  Between 7am to 6pm - Pager - [phone] After 6pm go to [email or web address] - password [email or web address] [facility]  Office  [phone]  CC: Primary care physician; Katheren Shams  Note: This dictation was prepared with Dragon dictation along with smaller phrase technology. Any transcriptional errors that result from this process are unintentional.

## 2017-09-22 NOTE — Plan of Care (Signed)
  Progressing Clinical Measurements: Cardiovascular complication will be avoided 09/22/2017 1307 - Progressing by Liliane Channel, RN Pain Managment: General experience of comfort will improve 09/22/2017 1307 - Progressing by Liliane Channel, RN Safety: Ability to remain free from injury will improve 09/22/2017 1307 - Progressing by Liliane Channel, RN

## 2017-09-22 NOTE — Plan of Care (Signed)
Pt complaints of pain throughout the night, pt remains on IV fluids and heparin gtt @ 31ml/hr. Up independently in the room. Pt NPO since midnight plan for cardiac cath today.

## 2017-09-22 NOTE — Progress Notes (Addendum)
Inpatient Diabetes Program Recommendations  AACE/ADA: New Consensus Statement on Inpatient Glycemic Control (2015)  Target Ranges:  Prepandial:   less than 140 mg/dL      Peak postprandial:   less than 180 mg/dL (1-2 hours)      Critically ill patients:  140 - 180 mg/dL  Results for Randy Luna, Randy Luna (MRN [id number]) as of 09/22/2017 08:12  Ref. Range 09/21/2017 14:09 09/21/2017 14:48 09/22/2017 04:30  Glucose Latest Ref Range: 65 - 99 mg/dL 491 (H)  268 (H)  Hemoglobin A1C Latest Ref Range: 4.8 - 5.6 %  10.6 (H)    Results for Randy Luna, Randy Luna (MRN [id number]) as of 09/22/2017 08:12  Ref. Range 09/21/2017 20:43  Glucose-Capillary Latest Ref Range: 65 - 99 mg/dL 251 (H)   Review of Glycemic Control  Diabetes history: DM2 Outpatient Diabetes medications: Lantus 50 units QHS, Metformin 1000 mg BID, Glimeperide 4mg  daily Current orders for Inpatient glycemic control: Lantus 50 units QHS  Inpatient Diabetes Program Recommendations Correction (SSI): Please consider ordering CBGs with Novolog 0-9 units Q4H. HgbA1C: A1C 10.6% on 09/21/17 indicating an average glucose of 258 mg/dl over the past 2-3 months.  Addendum 09/22/17@13 :30-Spoke with patient about diabetes and home regimen for diabetes control. Patient reports that he is followed by PCP for diabetes management and currently he takes Lantus 50 units QHS, Metformin 1000 mg BID, Glimeperide 4mg  daily as an outpatient for diabetes control. Patient reports that he is taking DM medication as prescribed.  Patient admits that he does not check his glucose at home.  Discussed A1C results (10.6% on 09/21/17) and explained that his current A1C indicates an average glucose of 258 mg/dl over the past 2-3 months. Discussed glucose and A1C goals. Discussed importance of checking CBGs and maintaining good CBG control to prevent long-term and short-term complications. Explained how hyperglycemia leads to damage within blood vessels which lead to the common complications  seen with uncontrolled diabetes. Stressed to the patient the importance of improving glycemic control to prevent further complications from uncontrolled diabetes. Encouraged patient to starting checking glucose at home 2-3 times per day. Explained how glucose trends could help his doctor with making appropriate adjustments with DM medications. Also encouraged patient to see PCP more frequently than every 6 months for DM control especially until DM is under control. Patient would likely benefit from being referred to an Endocrinologist to assist with improve DM control. Patient verbalized understanding of information discussed and he states that he has no further questions at this time related to diabetes.  Thanks, Barnie Alderman, RN, MSN, CDE Diabetes Coordinator Inpatient Diabetes Program [phone] (Team Pager from 8am to 5pm)

## 2017-09-22 NOTE — Progress Notes (Signed)
Transport from [employer]) is here to pick up patient. Discontinue telemetry monitor. Patient was at Heparin drip 16.5 ml/hr and NS at 75 ml/hr. Updated Nurse at [place], [place]. Belongings where sent with patient's wife.

## 2017-09-23 DIAGNOSIS — I214 Non-ST elevation (NSTEMI) myocardial infarction: Secondary | ICD-10-CM | POA: Insufficient documentation

## 2017-09-24 DIAGNOSIS — I25119 Atherosclerotic heart disease of native coronary artery with unspecified angina pectoris: Secondary | ICD-10-CM | POA: Insufficient documentation

## 2017-09-26 DIAGNOSIS — Z951 Presence of aortocoronary bypass graft: Secondary | ICD-10-CM | POA: Insufficient documentation

## 2017-10-10 DIAGNOSIS — E538 Deficiency of other specified B group vitamins: Secondary | ICD-10-CM | POA: Insufficient documentation

## 2017-10-13 ENCOUNTER — Other Ambulatory Visit
Admission: RE | Admit: 2017-10-13 | Discharge: 2017-10-13 | Disposition: A | Discharge status: Home / Self Care | Payer: Managed Care, Other (non HMO) | Source: Ambulatory Visit | Attending: Rheumatology | Admitting: Rheumatology

## 2017-10-13 DIAGNOSIS — M25572 Pain in left ankle and joints of left foot: Secondary | ICD-10-CM | POA: Insufficient documentation

## 2017-10-13 DIAGNOSIS — M1A00X0 Idiopathic chronic gout, unspecified site, without tophus (tophi): Secondary | ICD-10-CM | POA: Diagnosis present

## 2017-10-13 LAB — SYNOVIAL CELL COUNT + DIFF, W/ CRYSTALS
Eosinophils-Synovial: 0 %
Lymphocytes-Synovial Fld: 2 %
Monocyte-Macrophage-Synovial Fluid: 20 %
Neutrophil, Synovial: 78 %
Other Cells-SYN: 0
WBC, Synovial: 4544 /mm3 — ABNORMAL HIGH (ref 0–200)

## 2017-10-15 ENCOUNTER — Encounter: Payer: Self-pay | Admitting: Hematology and Oncology

## 2017-10-15 ENCOUNTER — Inpatient Hospital Stay: Payer: Managed Care, Other (non HMO)

## 2017-10-15 ENCOUNTER — Inpatient Hospital Stay: Payer: Managed Care, Other (non HMO) | Attending: Hematology and Oncology | Admitting: Hematology and Oncology

## 2017-10-15 VITALS — BP 132/79 | HR 81 | Temp 97.6°F | Resp 18 | Ht 66.0 in | Wt 226.6 lb

## 2017-10-15 DIAGNOSIS — Z79899 Other long term (current) drug therapy: Secondary | ICD-10-CM | POA: Insufficient documentation

## 2017-10-15 DIAGNOSIS — Z809 Family history of malignant neoplasm, unspecified: Secondary | ICD-10-CM | POA: Diagnosis not present

## 2017-10-15 DIAGNOSIS — Z7982 Long term (current) use of aspirin: Secondary | ICD-10-CM | POA: Diagnosis not present

## 2017-10-15 DIAGNOSIS — Z7984 Long term (current) use of oral hypoglycemic drugs: Secondary | ICD-10-CM | POA: Diagnosis not present

## 2017-10-15 DIAGNOSIS — E119 Type 2 diabetes mellitus without complications: Secondary | ICD-10-CM | POA: Diagnosis not present

## 2017-10-15 DIAGNOSIS — D649 Anemia, unspecified: Secondary | ICD-10-CM | POA: Insufficient documentation

## 2017-10-15 DIAGNOSIS — D473 Essential (hemorrhagic) thrombocythemia: Secondary | ICD-10-CM | POA: Insufficient documentation

## 2017-10-15 DIAGNOSIS — D75839 Thrombocytosis, unspecified: Secondary | ICD-10-CM | POA: Insufficient documentation

## 2017-10-15 DIAGNOSIS — Z794 Long term (current) use of insulin: Secondary | ICD-10-CM | POA: Insufficient documentation

## 2017-10-15 MED ORDER — SODIUM CHLORIDE 0.9 % IJ SOLN
5.00 | INTRAMUSCULAR | Status: DC
Start: ? — End: 2017-10-15

## 2017-10-15 MED ORDER — GLUCAGON HCL RDNA (DIAGNOSTIC) 1 MG IJ SOLR
1.00 | INTRAMUSCULAR | Status: DC
Start: ? — End: 2017-10-15

## 2017-10-15 MED ORDER — DEXTROSE 50 % IV SOLN
12.50 | INTRAVENOUS | Status: DC
Start: ? — End: 2017-10-15

## 2017-10-15 MED ORDER — LIDOCAINE HCL (PF) 1 % IJ SOLN
0.50 | INTRAMUSCULAR | Status: DC
Start: ? — End: 2017-10-15

## 2017-10-15 NOTE — Progress Notes (Signed)
Patient here today as new evaluation regarding elevated platelets and WBC.  Referred by Dr. Emily Filbert.  Accompanied by his wife, Lattie Haw today.

## 2017-10-15 NOTE — Progress Notes (Addendum)
[facility] day:  10/15/2017  Chief Complaint: COSMO TETREAULT is a 57 y.o. male with thrombocytosis who is referred in consultation by Dr. Emily Filbert for assessment and management.  HPI:  The patient was admitted to [facility] from 09/21/2017 - 09/22/2017 with chest pain.  He was diagnosed with a non-STEMI MI.  Cardiac cath revealed 3 vessel disease.  He was on a heparin drip.  He was treated with aspirin, statin, and Coreg.  He was referred for CABG.  CBC on 12/04/2016 revealed a hematocrit of 33.6, hemoglobin 11.2, MCV 82.8, platelets 270,000, WBC 11,600. CBC on 09/21/2017 revealed a hematocrit of 41.2, hemoglobin 13.6, MCV 84.5, platelets 406,000, WBC 10,100. CBC on 09/22/2017 revealed a hematocrit of 35.2, hemoglobin 12.3, MCV 82.8, platelets 334,000, WBC 9,800.  He was admitted to [facility] from 09/22/2017 - 10/01/2017.  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 The postoperative course was unremarkable from a cardiac perspective. He had hallucinations that resolved after holding narcotics and gabapentin and he was found to have uncontrolled diabetes with pre-op A1C of 11.1  CBC on 10/01/2017 revealed a hematocrit of 24.5, hemoglobin 7.8, MCV 86, platelets  309,000, WBC 9,200. CBC on 10/10/2017 revealed a hematocrit of 27.8, hemoglobin 8.8, MCV 85.3, platelets 1,240,000, WBC 14,400. CBC on 10/14/2017 revealed a hematocrit of 27.3, hemoglobin 8.9, MCV 84, platelets 1,246,000, WBC 20,800.  Folate was 15.3 on 09/22/2017.  B12 was 224 on 10/06/2017.  Ferritin was 179 and TIBC 271 on 09/22/2017. Additional testing were unable to be performed secondary to hemolysis.  He was diagnosed with gout in his feet and knees on 10/06/2017.  He states that this is a recurrent issue.  On 10/13/2017, he was seen by Dr Jefm Bryant, rheumatologist.  Left ankle was tapped and injected with Xylocaine , Marcaine, and Kenalog.  He was placed on a steroid  taper.  Blood sugar was 575 on 10/14/2017.  He was seen in the ER.  Steroids were discontinued yesterday secondary to elevated blood sugar.  Symptomatically, he feels fantastic.  Blood sugar is coming down.  Blood pressure has been good.  He is walking.  He has some shortness of breath with walking.  He denies any headache, erythromelalgia, numbness or tingling in his fingers or toes, visual changes, chest pain, or itching.  He has been taking aspirin 325 mg BID since 10/10/2017.  He began oral B12 and vitron C on 10/11/2017.  His diet now consists of chicken for lunch or supper.  He is not eating red meat secondary to gout.  He has a history of a benign brain tumor in 1999.  He presented with visual changes.  His father had a pulmonary embolism at age 32.   Past Medical History:  Diagnosis Date  . Diabetes mellitus without complication ([place])   . Hypertension     Past Surgical History:  Procedure Laterality Date  . addenoids    . APPENDECTOMY    . brain tumor removed    . Cardiac bypass  March 15, 20199   CABG X 4  . finger amputation repair    . HERNIA REPAIR  2004  . LEFT HEART CATH AND CORONARY ANGIOGRAPHY N/A 09/22/2017   Procedure: LEFT HEART CATH AND CORONARY ANGIOGRAPHY;  Surgeon: Teodoro Spray, MD;  Location: [facility];  Service: Cardiovascular;  Laterality: N/A;    Family History  Problem Relation Age of Onset  .  Deep vein thrombosis Father   . Diabetes Father   . Diabetes Mother   . Cancer Maternal Grandmother     Social History:  reports that he has never smoked. He has never used smokeless tobacco. He reports that he does not drink alcohol or use drugs.  He denies any exposure to radiation or toxins.  He works at a [employer].  He is currently off work.  He is not driving.  The patient is accompanied by his wife today.  Allergies: No Known Allergies  Current Medications: Current Outpatient Medications  Medication Sig Dispense Refill  . allopurinol  (ZYLOPRIM) 100 MG tablet Take 1 tablet (100 mg total) by mouth daily. 30 tablet 0  . LANTUS SOLOSTAR 100 UNIT/ML Solostar Pen Inject 50 Units into the skin daily at 10 pm.     . metFORMIN (GLUCOPHAGE) 1000 MG tablet Take 1,000 mg by mouth daily.    . metoprolol tartrate (LOPRESSOR) 25 MG tablet Take 25 mg by mouth daily.    . nitroGLYCERIN (NITROGLYN) 2 % ointment Apply 0.5 inches topically every 6 (six) hours. 30 g 0  . rosuvastatin (CRESTOR) 20 MG tablet Take 20 mg by mouth daily.    Marland Kitchen allopurinol (ZYLOPRIM) 100 MG tablet Take 100 mg by mouth 2 (two) times daily.    . Cyanocobalamin (VITAMIN B12) 3000 MCG SUBL Place 3,000 mcg under the tongue 2 (two) times a week.    Marland Kitchen HUMALOG KWIKPEN 100 UNIT/ML KiwkPen Inject 12 Units into the skin 3 (three) times daily.    . Iron-Vitamin C (VITRON-C) 65-125 MG TABS Take 65 mg by mouth daily.    . ranitidine (ZANTAC) 150 MG tablet Take 150 mg by mouth as needed.     No current facility-administered medications for this visit.     Review of Systems:  GENERAL:  Feels great.  No fevers or sweats.  Weight loss 236 pounds (presurgery) to 222 pounds (now). PERFORMANCE STATUS (ECOG):  1 HEENT:  No visual changes, runny nose, sore throat, mouth sores or tenderness. Lungs: Shortness of breath with walking.  No cough.  No hemoptysis. Cardiac:  No chest pain, palpitations, orthopnea, or PND. GI:  No nausea, vomiting, diarrhea, constipation, melena or hematochezia. GU:  No urgency, frequency, dysuria, or hematuria. Musculoskeletal:  No back pain.  No joint pain.  No muscle tenderness. Extremities:  Gout flare.  No pain or swelling. Skin:  No rashes or skin changes. Neuro:  No headache, numbness or weakness, balance or coordination issues. Endocrine:  No diabetes, thyroid issues, hot flashes or night sweats. Psych:  No mood changes, depression or anxiety. Pain:  No focal pain. Review of systems:  All other systems reviewed and found to be negative.  Physical  Exam: Blood pressure 132/79, pulse 81, temperature 97.6 F (36.4 C), temperature source Tympanic, resp. rate 18, height 5\' 6"  (1.676 m), weight 226 lb 10.1 oz (102.8 kg), SpO2 98 %. GENERAL:  Well developed, well nourished, gentleman sitting comfortably in the exam room in no acute distress. MENTAL STATUS:  Alert and oriented to person, place and time. HEAD:  Alopecia.  Scruffy gray beard.  Normocephalic, atraumatic, face symmetric, no Cushingoid features. EYES:  Glasses.  Pupils equal round and reactive to light and accomodation.  No conjunctivitis or scleral icterus. ENT:  Oropharynx clear without lesion.  Tongue normal. Mucous membranes moist.  RESPIRATORY:  Clear to auscultation without rales, wheezes or rhonchi. CARDIOVASCULAR:  Regular rate and rhythm without murmur, rub or gallop. CHEST:  Median sternotomy.  Incisions well healing with no evidence of infection. ABDOMEN:  Soft, non-tender, with active bowel sounds, and no appreciable hepatosplenomegaly.  No masses. SKIN:  No rashes, ulcers or lesions. EXTREMITIES: No edema, no skin discoloration or tenderness.  No palpable cords. LYMPH NODES: No palpable cervical, supraclavicular, axillary or inguinal adenopathy  NEUROLOGICAL: Unremarkable. PSYCH:  Appropriate.   Hospital Outpatient Visit on 10/13/2017  Component Date Value Ref Range Status  . Color, Synovial 10/13/2017 YELLOW* YELLOW Final  . Appearance-Synovial 10/13/2017 CLOUDY* CLEAR Final  . Crystals, Fluid 10/13/2017 INTRACELLULAR MONOSODIUM URATE CRYSTALS   Final  . WBC, Synovial 10/13/2017 4,544* 0 - 200 /cu mm Final  . Neutrophil, Synovial 10/13/2017 78  % Final  . Lymphocytes-Synovial Fld 10/13/2017 2  % Final  . Monocyte-Macrophage-Synovial Fluid 10/13/2017 20  % Final  . Eosinophils-Synovial 10/13/2017 0  % Final  . Other Cells-SYN 10/13/2017 0   Final   Performed at [facility], [address], [place], [place] [address]    Assessment:  NIKOLAS CASHER is  a 56 y.o. male with thrombocytosis since 10/10/2017.  Platelet count was normal on 10/01/2017.  He was diagnosed with a gout flare on 10/06/2017.  He has had issues with hyperglycemia.  Platelet count has been followed: 270,000 on 12/04/2016, 406,000 on 09/21/2017, 334,000 on 09/22/2017, 309,000 on 10/01/2017, 1,240,000 on 10/10/2017, and 1,246,000 on 10/14/2017.  He has a normocytic anemia.  Work-up revealed a normal folate 915.3), ferritin (179), and TIBC (271).  B12 was low (224).  Additional testing were unable to be performed secondary to hemolysis.  He is on oral iron and oral B12.  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  He has diabetes.  He is on aspirin 325 mg BID.  Symptomatically, he feels good.  Exam reveals no adenopathy or appreciable hepatosplenomegaly.  Plan: 1.  Discuss acute onset of thrombocytosis following CABG and gout flare.  He is anemic.  Suspect thrombocytosis is an acute phase reactant, although platelet count is extreme.  Doubt myeloproliferative disorder.  Discuss complete work-up to expedite evalaution and treatment.  2.  Discuss recent labs.  Discuss work-up of anemia.  Unclear if any patient hemolysis or red cell breakdown in tube (artifact).  Discuss additional testing. 3.  [facility] labs today:  CBC with diff, retic, DAT, cold agglutins, haptoglobin, LDH, ferritin, iron studies, sed rate, CRP, JAK2 with reflex CALR/MPL, and BCR/ABL. 4.  Discuss decreasing aspirin to 81 mg a day. 5.  RTC in 1 week for MD assessment and review of testing.   Lequita Asal, MD  10/15/2017, 4:35 PM

## 2017-10-16 ENCOUNTER — Telehealth: Payer: Self-pay | Admitting: *Deleted

## 2017-10-16 NOTE — Telephone Encounter (Signed)
[facility] called critical results of Platelet count of 1031

## 2017-10-22 ENCOUNTER — Ambulatory Visit: Payer: Managed Care, Other (non HMO) | Admitting: Hematology and Oncology

## 2017-10-22 ENCOUNTER — Inpatient Hospital Stay (HOSPITAL_BASED_OUTPATIENT_CLINIC_OR_DEPARTMENT_OTHER): Payer: Managed Care, Other (non HMO) | Admitting: Hematology and Oncology

## 2017-10-22 VITALS — BP 123/81 | HR 99 | Temp 99.1°F | Resp 18 | Wt 228.8 lb

## 2017-10-22 DIAGNOSIS — E119 Type 2 diabetes mellitus without complications: Secondary | ICD-10-CM | POA: Diagnosis not present

## 2017-10-22 DIAGNOSIS — Z79899 Other long term (current) drug therapy: Secondary | ICD-10-CM | POA: Diagnosis not present

## 2017-10-22 DIAGNOSIS — D649 Anemia, unspecified: Secondary | ICD-10-CM | POA: Diagnosis not present

## 2017-10-22 DIAGNOSIS — Z7982 Long term (current) use of aspirin: Secondary | ICD-10-CM

## 2017-10-22 DIAGNOSIS — D75839 Thrombocytosis, unspecified: Secondary | ICD-10-CM

## 2017-10-22 DIAGNOSIS — Z809 Family history of malignant neoplasm, unspecified: Secondary | ICD-10-CM

## 2017-10-22 DIAGNOSIS — Z7984 Long term (current) use of oral hypoglycemic drugs: Secondary | ICD-10-CM

## 2017-10-22 DIAGNOSIS — Z7189 Other specified counseling: Secondary | ICD-10-CM | POA: Insufficient documentation

## 2017-10-22 DIAGNOSIS — Z794 Long term (current) use of insulin: Secondary | ICD-10-CM

## 2017-10-22 DIAGNOSIS — D473 Essential (hemorrhagic) thrombocythemia: Secondary | ICD-10-CM | POA: Diagnosis not present

## 2017-10-22 NOTE — Progress Notes (Signed)
[facility] day:  10/22/2017   Chief Complaint: Randy Luna is a 57 y.o. 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  HPI:  The patient was last seen in the hematology clinic on 10/15/2017 for initial consultation.  He was diagnosed with thrombocytosis on 10/10/2017.  Platelet count was normal on 10/01/2017.  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  He had been diagnosed with a gout flare on 10/06/2017.  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  He underwent a work-up.  CBC revealed a hematocrit 27.9, hemoglobin 8.6, MCV 84, platelets 1,031,000, WBC 18,300 with an ANC of 15,300.  Retic was 3.9%.  Coombs was negative.  Cold agglutins was negative.  Haptoglobin was 587.  LDH was 212. Ferritin was 313.  Iron saturation was 16% and TIBC 271.  Sed rate was 51 and CRP was 45.4. JAK2 V617F, exons 12-15, CALR, and MPL were negative.  BCR/ABL is pending.  Symptomatically, patient is doing well. He is fatigued due to today's schedule. He has had several doctor's appointments. Blood sugars have been under better control (160-170s). Patient denies recurrent gout symptoms. He continues on his daily allopurinol. Patient is no longer on his corticosteroid therapy.  Patient denies any bruising or bleeding.  He has not had any chest pain or shortness of breath. Patient is eating well. His weight has increased by 2 pounds.   Patient denies pain in the clinic today.    Past Medical History:  Diagnosis Date  . Diabetes mellitus without complication ([place])   . Hypertension     Past Surgical History:  Procedure Laterality Date  . addenoids    . APPENDECTOMY    . brain tumor removed    . Cardiac bypass  March 15, 20199   CABG X 4  . finger amputation repair    . HERNIA REPAIR  2004  . LEFT HEART CATH AND CORONARY ANGIOGRAPHY N/A 09/22/2017   Procedure: LEFT HEART CATH AND CORONARY ANGIOGRAPHY;   Surgeon: Teodoro Spray, MD;  Location: [facility];  Service: Cardiovascular;  Laterality: N/A;    Family History  Problem Relation Age of Onset  . Deep vein thrombosis Father   . Diabetes Father   . Diabetes Mother   . Cancer Maternal Grandmother     Social History:  reports that he has never smoked. He has never used smokeless tobacco. He reports that he does not drink alcohol or use drugs.  He denies any exposure to radiation or toxins.  He works at a [employer].  He is currently off work.  He is not driving.  The patient is accompanied by his wife today.  Allergies: No Known Allergies  Current Medications: Current Outpatient Medications  Medication Sig Dispense Refill  . allopurinol (ZYLOPRIM) 100 MG tablet Take 1 tablet (100 mg total) by mouth daily. 30 tablet 0  . allopurinol (ZYLOPRIM) 100 MG tablet Take 100 mg by mouth 2 (two) times daily.    . Cyanocobalamin (VITAMIN B12) 3000 MCG SUBL Place 3,000 mcg under the tongue 2 (two) times a week.    Marland Kitchen HUMALOG KWIKPEN 100 UNIT/ML KiwkPen Inject 12 Units into the skin 3 (three) times daily.    . Iron-Vitamin C (VITRON-C) 65-125 MG TABS Take 65 mg by mouth daily.    Marland Kitchen LANTUS SOLOSTAR 100 UNIT/ML Solostar Pen Inject 50 Units into the skin daily at 10 pm.     .  metFORMIN (GLUCOPHAGE) 1000 MG tablet Take 1,000 mg by mouth daily.    . metoprolol tartrate (LOPRESSOR) 25 MG tablet Take 25 mg by mouth daily.    . nitroGLYCERIN (NITROGLYN) 2 % ointment Apply 0.5 inches topically every 6 (six) hours. 30 g 0  . ranitidine (ZANTAC) 150 MG tablet Take 150 mg by mouth as needed.    . rosuvastatin (CRESTOR) 20 MG tablet Take 20 mg by mouth daily.     No current facility-administered medications for this visit.     Review of Systems:  GENERAL:  Feels "fine".  Little tired today.  No fevers, sweats.  Weight up 2 pounds. PERFORMANCE STATUS (ECOG):  1 HEENT:  No visual changes, runny nose, sore throat, mouth sores or  tenderness. Lungs:  Shortness of breath with walking.  No cough.  No hemoptysis. Cardiac:  No chest pain, palpitations, orthopnea, or PND. GI:  No nausea, vomiting, diarrhea, constipation, melena or hematochezia. GU:  No urgency, frequency, dysuria, or hematuria. Musculoskeletal:  No back pain.  No joint pain.  No muscle tenderness. Extremities:  Gout, resolved.  No pain or swelling. Skin:  No rashes or skin changes. Neuro:  No headache, numbness or weakness, balance or coordination issues. Endocrine:  Diabetes.  Blood sugar 140-150s (max 170).  No thyroid issues, hot flashes or night sweats. Psych:  No mood changes, depression or anxiety. Pain:  No focal pain. Review of systems:  All other systems reviewed and found to be negative.   Physical Exam: Blood pressure 123/81, pulse 99, temperature 99.1 F (37.3 C), temperature source Tympanic, resp. rate 18, weight 228 lb 13.4 oz (103.8 kg), SpO2 99 %. GENERAL:  Well developed, well nourished, gentleman sitting comfortably in the exam room in no acute distress. MENTAL STATUS:  Alert and oriented to person, place and time. HEAD:  Male pattern baldness.  Normocephalic, atraumatic, face symmetric, no Cushingoid features. EYES:  Glasses.  No conjunctivitis or scleral icterus. NEUROLOGICAL: Unremarkable. PSYCH:  Appropriate.   No visits with results within 3 Day(s) from this visit.  Latest known visit with results is:  Hospital Outpatient Visit on 10/13/2017  Component Date Value Ref Range Status  . Color, Synovial 10/13/2017 YELLOW* YELLOW Final  . Appearance-Synovial 10/13/2017 CLOUDY* CLEAR Final  . Crystals, Fluid 10/13/2017 INTRACELLULAR MONOSODIUM URATE CRYSTALS   Final  . WBC, Synovial 10/13/2017 4,544* 0 - 200 /cu mm Final  . Neutrophil, Synovial 10/13/2017 78  % Final  . Lymphocytes-Synovial Fld 10/13/2017 2  % Final  . Monocyte-Macrophage-Synovial Fluid 10/13/2017 20  % Final  . Eosinophils-Synovial 10/13/2017 0  % Final  .  Other Cells-SYN 10/13/2017 0   Final   Performed at [facility], [address] [place], [place] [address]    Assessment:  Randy Luna is a 57 y.o. male with thrombocytosis since 10/10/2017.  Platelet count was normal on 10/01/2017.  He was diagnosed with a gout flare on 10/06/2017.  He has had issues with hyperglycemia.  Platelet count has been followed: 270,000 on 12/04/2016, 406,000 on 09/21/2017, 334,000 on 09/22/2017, 309,000 on 10/01/2017, 1,240,000 on 10/10/2017, 1,246,000 on 10/14/2017, and 1,031,000 on 10/15/2017.  Work-up on 10/15/2017 revealed the following normal studies:  JAK2, CALR, and MPL.  Hematocrit was 27.9, hemoglobin 8.6, MCV 84, platelets 1,031,000, WBC 18,300 with an ANC of 15,300.  Retic was 3.9%.   Additional normal studies included:  Coombs, cold agglutins, haptoglobin, LDH.  Ferritin was 313.  Iron saturation was 16% and TIBC 271.  Sed  rate and CRP were elevated. BCR/ABL is pending.  He has a normocytic anemia.  Work-up revealed a normal folate 915.3), ferritin (179), and TIBC (271).  B12 was low (224).  Additional testing were unable to be performed secondary to hemolysis.  He is on oral iron and oral B12.  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  He has diabetes.  He is on aspirin 325 mg BID.  Symptomatically, he feels a little tired today. Gout has resolved.  He denies chest pain and shortness of breath. He denies erythromelalgia. Exam is stable.  Plan: 1.  Review [facility] labs.  No apparent myeloproliferative disorder as JAK2, CALR, and MPL normal.  Await BCR-ABL (suspect normal).  Discuss elevated ESR and CRP indicating an acute phase reaction. 2.  BCR/ABL pending. Will need to call patient with results.  3.  Continue aspirin to 81 mg. 4.  RTC PRN.   Honor Loh, NP  10/22/2017, 3:39 PM  I saw and evaluated the patient, participating in the key portions of the service and reviewing pertinent diagnostic studies and  records.  I reviewed the nurse practitioner's note and agree with the findings and the plan.  Multiple questions were asked by the patient and answered.   Nolon Stalls, MD 10/22/2017,3:39 PM

## 2017-10-22 NOTE — Progress Notes (Signed)
Patient offers no complaints today. 

## 2017-10-30 ENCOUNTER — Encounter: Payer: Managed Care, Other (non HMO) | Attending: Internal Medicine | Admitting: *Deleted

## 2017-10-30 ENCOUNTER — Encounter: Payer: Self-pay | Admitting: *Deleted

## 2017-10-30 VITALS — Ht 66.5 in | Wt 232.1 lb

## 2017-10-30 DIAGNOSIS — E119 Type 2 diabetes mellitus without complications: Secondary | ICD-10-CM | POA: Insufficient documentation

## 2017-10-30 DIAGNOSIS — Z951 Presence of aortocoronary bypass graft: Secondary | ICD-10-CM | POA: Diagnosis not present

## 2017-10-30 DIAGNOSIS — Z79899 Other long term (current) drug therapy: Secondary | ICD-10-CM | POA: Insufficient documentation

## 2017-10-30 DIAGNOSIS — Z7984 Long term (current) use of oral hypoglycemic drugs: Secondary | ICD-10-CM | POA: Insufficient documentation

## 2017-10-30 DIAGNOSIS — Z48812 Encounter for surgical aftercare following surgery on the circulatory system: Secondary | ICD-10-CM | POA: Insufficient documentation

## 2017-10-30 DIAGNOSIS — I10 Essential (primary) hypertension: Secondary | ICD-10-CM | POA: Diagnosis not present

## 2017-10-30 NOTE — Patient Instructions (Signed)
Patient Instructions  Patient Details  Name: Randy Luna MRN: [id number] Date of Birth: 1960/09/06 Referring Provider:  Rusty Aus, MD  Below are your personal goals for exercise, nutrition, and risk factors. Our goal is to help you stay on track towards obtaining and maintaining these goals. We will be discussing your progress on these goals with you throughout the program.  Initial Exercise Prescription: Initial Exercise Prescription - 10/30/17 1300      Date of Initial Exercise RX and Referring Provider   Date  10/30/17    Referring Provider  Sabra Heck      Treadmill   MPH  2.5    Grade  1.5    Minutes  15    METs  3.43      Recumbant Bike   Level  6    RPM  60    Watts  40    Minutes  15    METs  3.4      T5 Nustep   Level  3    SPM  80    Minutes  15    METs  3.4      Prescription Details   Frequency (times per week)  3    Duration  Progress to 45 minutes of aerobic exercise without signs/symptoms of physical distress      Intensity   THRR 40-80% of Max Heartrate  114-148    Ratings of Perceived Exertion  11-13    Perceived Dyspnea  0-4      Resistance Training   Training Prescription  Yes    Weight  4 lb    Reps  10-15       Exercise Goals: Frequency: Be able to perform aerobic exercise two to three times per week in program working toward 2-5 days per week of home exercise.  Intensity: Work with a perceived exertion of 11 (fairly light) - 15 (hard) while following your exercise prescription.  We will make changes to your prescription with you as you progress through the program.   Duration: Be able to do 30 to 45 minutes of continuous aerobic exercise in addition to a 5 minute warm-up and a 5 minute cool-down routine.   Nutrition Goals: Your personal nutrition goals will be established when you do your nutrition analysis with the dietician.  The following are general nutrition guidelines to follow: Cholesterol < 200mg /day Sodium <  1500mg /day Fiber: Men over 50 yrs - 30 grams per day  Personal Goals: Personal Goals and Risk Factors at Admission - 10/30/17 1322      Core Components/Risk Factors/Patient Goals on Admission    Weight Management  Yes;Weight Loss    Intervention  Weight Management: Develop a combined nutrition and exercise program designed to reach desired caloric intake, while maintaining appropriate intake of nutrient and fiber, sodium and fats, and appropriate energy expenditure required for the weight goal.;Weight Management/Obesity: Establish reasonable short term and long term weight goals.    Admit Weight  222 lb (100.7 kg)    Goal Weight: Short Term  217 lb (98.4 kg)    Goal Weight: Long Term  200 lb (90.7 kg)    Expected Outcomes  Short Term: Continue to assess and modify interventions until short term weight is achieved;Long Term: Adherence to nutrition and physical activity/exercise program aimed toward attainment of established weight goal;Weight Loss: Understanding of general recommendations for a balanced deficit meal plan, which promotes 1-2 lb weight loss per week and includes a negative energy balance  of [phone] kcal/d;Understanding recommendations for meals to include 15-35% energy as protein, 25-35% energy from fat, 35-60% energy from carbohydrates, less than 200mg  of dietary cholesterol, 20-35 gm of total fiber daily;Understanding of distribution of calorie intake throughout the day with the consumption of 4-5 meals/snacks    Diabetes  Yes    Intervention  Provide education about signs/symptoms and action to take for hypo/hyperglycemia.;Provide education about proper nutrition, including hydration, and aerobic/resistive exercise prescription along with prescribed medications to achieve blood glucose in normal ranges: Fasting glucose 65-99 mg/dL    Expected Outcomes  Short Term: Participant verbalizes understanding of the signs/symptoms and immediate care of hyper/hypoglycemia, proper foot care and  importance of medication, aerobic/resistive exercise and nutrition plan for blood glucose control.;Long Term: Attainment of HbA1C < 7%.    Hypertension  Yes    Intervention  Provide education on lifestyle modifcations including regular physical activity/exercise, weight management, moderate sodium restriction and increased consumption of fresh fruit, vegetables, and low fat dairy, alcohol moderation, and smoking cessation.;Monitor prescription use compliance.    Expected Outcomes  Short Term: Continued assessment and intervention until BP is < 140/86mm HG in hypertensive participants. < 130/30mm HG in hypertensive participants with diabetes, heart failure or chronic kidney disease.;Long Term: Maintenance of blood pressure at goal levels.    Lipids  Yes    Intervention  Provide education and support for participant on nutrition & aerobic/resistive exercise along with prescribed medications to achieve LDL 70mg , HDL >40mg .    Expected Outcomes  Short Term: Participant states understanding of desired cholesterol values and is compliant with medications prescribed. Participant is following exercise prescription and nutrition guidelines.;Long Term: Cholesterol controlled with medications as prescribed, with individualized exercise RX and with personalized nutrition plan. Value goals: LDL < 70mg , HDL > 40 mg.       Tobacco Use Initial Evaluation: Social History   Tobacco Use  Smoking Status Never Smoker  Smokeless Tobacco Never Used    Exercise Goals and Review: Exercise Goals    Row Name 10/30/17 1353             Exercise Goals   Increase Physical Activity  Yes       Intervention  Provide advice, education, support and counseling about physical activity/exercise needs.;Develop an individualized exercise prescription for aerobic and resistive training based on initial evaluation findings, risk stratification, comorbidities and participant's personal goals.       Expected Outcomes  Short Term:  Attend rehab on a regular basis to increase amount of physical activity.;Long Term: Add in home exercise to make exercise part of routine and to increase amount of physical activity.;Long Term: Exercising regularly at least 3-5 days a week.       Increase Strength and Stamina  Yes       Intervention  Provide advice, education, support and counseling about physical activity/exercise needs.;Develop an individualized exercise prescription for aerobic and resistive training based on initial evaluation findings, risk stratification, comorbidities and participant's personal goals.       Expected Outcomes  Short Term: Increase workloads from initial exercise prescription for resistance, speed, and METs.;Short Term: Perform resistance training exercises routinely during rehab and add in resistance training at home;Long Term: Improve cardiorespiratory fitness, muscular endurance and strength as measured by increased METs and functional capacity (6MWT)       Able to understand and use rate of perceived exertion (RPE) scale  Yes       Intervention  Provide education and explanation on how to use  RPE scale       Expected Outcomes  Short Term: Able to use RPE daily in rehab to express subjective intensity level;Long Term:  Able to use RPE to guide intensity level when exercising independently       Able to understand and use Dyspnea scale  Yes       Intervention  Provide education and explanation on how to use Dyspnea scale       Expected Outcomes  Short Term: Able to use Dyspnea scale daily in rehab to express subjective sense of shortness of breath during exertion;Long Term: Able to use Dyspnea scale to guide intensity level when exercising independently       Knowledge and understanding of Target Heart Rate Range (THRR)  Yes       Intervention  Provide education and explanation of THRR including how the numbers were predicted and where they are located for reference       Expected Outcomes  Short Term: Able to  state/look up THRR;Short Term: Able to use daily as guideline for intensity in rehab;Long Term: Able to use THRR to govern intensity when exercising independently       Able to check pulse independently  Yes       Intervention  Provide education and demonstration on how to check pulse in carotid and radial arteries.;Review the importance of being able to check your own pulse for safety during independent exercise       Expected Outcomes  Short Term: Able to explain why pulse checking is important during independent exercise;Long Term: Able to check pulse independently and accurately       Understanding of Exercise Prescription  Yes       Intervention  Provide education, explanation, and written materials on patient's individual exercise prescription       Expected Outcomes  Short Term: Able to explain program exercise prescription;Long Term: Able to explain home exercise prescription to exercise independently          Copy of goals given to participant.

## 2017-10-30 NOTE — Progress Notes (Signed)
Cardiac Individual Treatment Plan  Patient Details  Name: Randy Luna MRN: [id number] Date of Birth: September 18, 1960 Referring Provider:     Cardiac Rehab from 10/30/2017 in [facility] Cardiac and Pulmonary Rehab  Referring Provider  Sabra Heck      Initial Encounter Date:    Cardiac Rehab from 10/30/2017 in [facility] Cardiac and Pulmonary Rehab  Date  10/30/17  Referring Provider  Sabra Heck      Visit Diagnosis: S/P CABG x 4  Patient's Home Medications on Admission:  Current Outpatient Medications:  .  allopurinol (ZYLOPRIM) 100 MG tablet, Take 100 mg by mouth 2 (two) times daily., Disp: , Rfl:  .  aspirin 325 MG tablet, Take 650 mg by mouth daily., Disp: , Rfl:  .  Cyanocobalamin (VITAMIN B12) 3000 MCG SUBL, Place 3,000 mcg under the tongue 2 (two) times a week., Disp: , Rfl:  .  HUMALOG KWIKPEN 100 UNIT/ML KiwkPen, Inject 12 Units into the skin 3 (three) times daily., Disp: , Rfl:  .  Iron-Vitamin C (VITRON-C) 65-125 MG TABS, Take 65 mg by mouth daily., Disp: , Rfl:  .  LANTUS SOLOSTAR 100 UNIT/ML Solostar Pen, Inject 24 Units into the skin at bedtime. , Disp: , Rfl:  .  metFORMIN (GLUCOPHAGE) 1000 MG tablet, Take 1,000 mg by mouth 2 (two) times daily with a meal. , Disp: , Rfl:  .  metoprolol tartrate (LOPRESSOR) 25 MG tablet, Take 25 mg by mouth daily., Disp: , Rfl:  .  rosuvastatin (CRESTOR) 20 MG tablet, Take 20 mg by mouth daily., Disp: , Rfl:  .  allopurinol (ZYLOPRIM) 100 MG tablet, Take 1 tablet (100 mg total) by mouth daily. (Patient not taking: Reported on 10/30/2017), Disp: 30 tablet, Rfl: 0 .  nitroGLYCERIN (NITROGLYN) 2 % ointment, Apply 0.5 inches topically every 6 (six) hours. (Patient not taking: Reported on 10/30/2017), Disp: 30 g, Rfl: 0 .  ranitidine (ZANTAC) 150 MG tablet, Take 150 mg by mouth as needed., Disp: , Rfl:   Past Medical History: Past Medical History:  Diagnosis Date  . Diabetes mellitus without complication ([place])   . Hypertension     Tobacco Use: Social History    Tobacco Use  Smoking Status Never Smoker  Smokeless Tobacco Never Used    Labs: Recent Review Scientist, physiological    Labs for ITP Cardiac and Pulmonary Rehab Latest Ref Rng & Units 12/03/2016 09/21/2017 09/22/2017   Cholestrol 0 - 200 mg/dL - - 237(H)   LDLCALC 0 - 99 mg/dL - - 141(H)   HDL >40 mg/dL - - 36(L)   Trlycerides <150 mg/dL - - 301(H)   Hemoglobin A1c 4.8 - 5.6 % 11.3(H) 10.6(H) -       Exercise Target Goals: Date: 10/30/17  Exercise Program Goal: Individual exercise prescription set using results from initial 6 min walk test and THRR while considering  patient's activity barriers and safety.   Exercise Prescription Goal: Initial exercise prescription builds to 30-45 minutes a day of aerobic activity, 2-3 days per week.  Home exercise guidelines will be given to patient during program as part of exercise prescription that the participant will acknowledge.  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    Cardiac Risk Stratification  Moderate       6 Minute Walk: 6 Minute Walk  [place] Name 10/30/17 1355         6 Minute Walk   Distance  1186 feet     Walk Time  6 minutes     # of Rest Breaks  0     MPH  2.25     METS  3.43     RPE  12     Perceived Dyspnea   0     VO2 Peak  12.01     Symptoms  No     Resting HR  82 bpm     Resting BP  144/94     Resting Oxygen Saturation   99 %     Exercise Oxygen Saturation  during 6 min walk  100 %     Max Ex. HR  127 bpm     Max Ex. BP  158/84     2 Minute Post BP  142/86        Oxygen Initial Assessment:   Oxygen Re-Evaluation:   Oxygen Discharge (Final Oxygen Re-Evaluation):   Initial Exercise Prescription: Initial Exercise Prescription - 10/30/17 1300      Date of Initial Exercise RX and  Referring Provider   Date  10/30/17    Referring Provider  Sabra Heck      Treadmill   MPH  2.5    Grade  1.5    Minutes  15    METs  3.43      Recumbant Bike   Level  6    RPM  60    Watts  40    Minutes  15    METs  3.4      T5 Nustep   Level  3    SPM  80    Minutes  15    METs  3.4      Prescription Details   Frequency (times per week)  3    Duration  Progress to 45 minutes of aerobic exercise without signs/symptoms of physical distress      Intensity   THRR 40-80% of Max Heartrate  114-148    Ratings of Perceived Exertion  11-13    Perceived Dyspnea  0-4      Resistance Training   Training Prescription  Yes    Weight  4 lb    Reps  10-15       Perform Capillary Blood Glucose checks as needed.  Exercise Prescription Changes: Exercise Prescription Changes    Row Name 10/30/17 1300             Response to Exercise   Blood Pressure (Admit)  144/94       Blood Pressure (Exercise)  158/84       Blood Pressure (Exit)  142/84       Heart Rate (Admit)  88 bpm       Heart Rate (Exercise)  127 bpm       Heart Rate (Exit)  89 bpm       Oxygen Saturation (Admit)  99 %       Oxygen Saturation (Exit)  100 %       Rating of Perceived Exertion (Exercise)  12          Exercise Comments:   Exercise Goals and Review: Exercise Goals    Row Name 10/30/17 1353             Exercise Goals   Increase Physical Activity  Yes       Intervention  Provide advice,  education, support and counseling about physical activity/exercise needs.;Develop an individualized exercise prescription for aerobic and resistive training based on initial evaluation findings, risk stratification, comorbidities and participant's personal goals.       Expected Outcomes  Short Term: Attend rehab on a regular basis to increase amount of physical activity.;Long Term: Add in home exercise to make exercise part of routine and to increase amount of physical activity.;Long Term: Exercising regularly  at least 3-5 days a week.       Increase Strength and Stamina  Yes       Intervention  Provide advice, education, support and counseling about physical activity/exercise needs.;Develop an individualized exercise prescription for aerobic and resistive training based on initial evaluation findings, risk stratification, comorbidities and participant's personal goals.       Expected Outcomes  Short Term: Increase workloads from initial exercise prescription for resistance, speed, and METs.;Short Term: Perform resistance training exercises routinely during rehab and add in resistance training at home;Long Term: Improve cardiorespiratory fitness, muscular endurance and strength as measured by increased METs and functional capacity (6MWT)       Able to understand and use rate of perceived exertion (RPE) scale  Yes       Intervention  Provide education and explanation on how to use RPE scale       Expected Outcomes  Short Term: Able to use RPE daily in rehab to express subjective intensity level;Long Term:  Able to use RPE to guide intensity level when exercising independently       Able to understand and use Dyspnea scale  Yes       Intervention  Provide education and explanation on how to use Dyspnea scale       Expected Outcomes  Short Term: Able to use Dyspnea scale daily in rehab to express subjective sense of shortness of breath during exertion;Long Term: Able to use Dyspnea scale to guide intensity level when exercising independently       Knowledge and understanding of Target Heart Rate Range (THRR)  Yes       Intervention  Provide education and explanation of THRR including how the numbers were predicted and where they are located for reference       Expected Outcomes  Short Term: Able to state/look up THRR;Short Term: Able to use daily as guideline for intensity in rehab;Long Term: Able to use THRR to govern intensity when exercising independently       Able to check pulse independently  Yes        Intervention  Provide education and demonstration on how to check pulse in carotid and radial arteries.;Review the importance of being able to check your own pulse for safety during independent exercise       Expected Outcomes  Short Term: Able to explain why pulse checking is important during independent exercise;Long Term: Able to check pulse independently and accurately       Understanding of Exercise Prescription  Yes       Intervention  Provide education, explanation, and written materials on patient's individual exercise prescription       Expected Outcomes  Short Term: Able to explain program exercise prescription;Long Term: Able to explain home exercise prescription to exercise independently          Exercise Goals Re-Evaluation :   Discharge Exercise Prescription (Final Exercise Prescription Changes): Exercise Prescription Changes - 10/30/17 1300      Response to Exercise   Blood Pressure (Admit)  144/94  Blood Pressure (Exercise)  158/84    Blood Pressure (Exit)  142/84    Heart Rate (Admit)  88 bpm    Heart Rate (Exercise)  127 bpm    Heart Rate (Exit)  89 bpm    Oxygen Saturation (Admit)  99 %    Oxygen Saturation (Exit)  100 %    Rating of Perceived Exertion (Exercise)  12       Nutrition:  Target Goals: Understanding of nutrition guidelines, daily intake of sodium <1536m, cholesterol <2098m calories 30% from fat and 7% or less from saturated fats, daily to have 5 or more servings of fruits and vegetables.  Biometrics: Pre Biometrics - 10/30/17 1353      Pre Biometrics   Height  5' 6.5" (1.689 m)    Weight  232 lb 1.6 oz (105.3 kg)    Waist Circumference  43 inches    Hip Circumference  47 inches    Waist to Hip Ratio  0.91 %    BMI (Calculated)  36.91    Single Leg Stand  4.5 seconds        Nutrition Therapy Plan and Nutrition Goals: Nutrition Therapy & Goals - 10/30/17 1330      Intervention Plan   Intervention  Prescribe, educate and counsel  regarding individualized specific dietary modifications aiming towards targeted core components such as weight, hypertension, lipid management, diabetes, heart failure and other comorbidities.;Nutrition handout(s) given to patient.    Expected Outcomes  Short Term Goal: Understand basic principles of dietary content, such as calories, fat, sodium, cholesterol and nutrients.;Short Term Goal: A plan has been developed with personal nutrition goals set during dietitian appointment.;Long Term Goal: Adherence to prescribed nutrition plan.       Nutrition Assessments: Nutrition Assessments - 10/30/17 1330      MEDFICTS Scores   Pre Score  6       Nutrition Goals Re-Evaluation:   Nutrition Goals Discharge (Final Nutrition Goals Re-Evaluation):   Psychosocial: Target Goals: Acknowledge presence or absence of significant depression and/or stress, maximize coping skills, provide positive support system. Participant is able to verbalize types and ability to use techniques and skills needed for reducing stress and depression.   Initial Review & Psychosocial Screening: Initial Psych Review & Screening - 10/30/17 1334      Initial Review   Current issues with  Current Stress Concerns    Source of Stress Concerns  Unable to participate in former interests or hobbies;Unable to perform yard/household activities    Comments  Started back 1/2 days at work after surgery, about to start back full time. 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 He is still limited post surgery, but is anxious to get back to activities.      Family Dynamics   Good Support System?  Yes spouse      Barriers   Psychosocial barriers to participate in program  There are no identifiable barriers or psychosocial needs.;The patient should benefit from training in stress management and relaxation.      Screening Interventions    Interventions  Encouraged to exercise;Provide feedback about the scores to participant;Program counselor consult;To provide support and resources with identified psychosocial needs    Expected Outcomes  Short Term goal: Utilizing psychosocial counselor, staff and physician to assist with identification of specific Stressors or current issues interfering with healing process. Setting desired goal for each  stressor or current issue identified.;Long Term Goal: Stressors or current issues are controlled or eliminated.;Short Term goal: Identification and review with participant of any Quality of Life or Depression concerns found by scoring the questionnaire.;Long Term goal: The participant improves quality of Life and PHQ9 Scores as seen by post scores and/or verbalization of changes       Quality of Life Scores:  Quality of Life - 10/30/17 1340      Quality of Life Scores   Health/Function Pre  26.4 %    Socioeconomic Pre  29.25 %    Psych/Spiritual Pre  29.14 %    Family Pre  30 %    GLOBAL Pre  28.06 %      Scores of 19 and below usually indicate a poorer quality of life in these areas.  A difference of  2-3 points is a clinically meaningful difference.  A difference of 2-3 points in the total score of the Quality of Life Index has been associated with significant improvement in overall quality of life, self-image, physical symptoms, and general health in studies assessing change in quality of life.  PHQ-9: Recent Review Flowsheet Data    Depression screen [facility] 2/9 10/30/2017   Decreased Interest 0   Down, Depressed, Hopeless 0   PHQ - 2 Score 0   Altered sleeping 0   Tired, decreased energy 0   Change in appetite 0   Feeling bad or failure about yourself  0   Trouble concentrating 0   Moving slowly or fidgety/restless 0   Suicidal thoughts 0   PHQ-9 Score 0     Interpretation of Total Score  Total Score Depression Severity:  1-4 = Minimal depression, 5-9 = Mild depression, 10-14 =  Moderate depression, 15-19 = Moderately severe depression, 20-27 = Severe depression   Psychosocial Evaluation and Intervention:   Psychosocial Re-Evaluation:   Psychosocial Discharge (Final Psychosocial Re-Evaluation):   Vocational Rehabilitation: Provide vocational rehab assistance to qualifying candidates.   Vocational Rehab Evaluation & Intervention: Vocational Rehab - 10/30/17 1347      Initial Vocational Rehab Evaluation & Intervention   Assessment shows need for Vocational Rehabilitation  No       Education: Education Goals: Education classes will be provided on a variety of topics geared toward better understanding of heart health and risk factor modification. Participant will state understanding/return demonstration of topics presented as noted by education test scores.  Learning Barriers/Preferences: Learning Barriers/Preferences - 10/30/17 1346      Learning Barriers/Preferences   Learning Barriers  None    Learning Preferences  Verbal Instruction;Individual Instruction       Education Topics:  AED/CPR: - Group verbal and written instruction with the use of models to demonstrate the basic use of the AED with the basic ABC's of resuscitation.   General Nutrition Guidelines/Fats and Fiber: -Group instruction provided by verbal, written material, models and posters to present the general guidelines for heart healthy nutrition. Gives an explanation and review of dietary fats and fiber.   Controlling Sodium/Reading Food Labels: -Group verbal and written material supporting the discussion of sodium use in heart healthy nutrition. Review and explanation with models, verbal and written materials for utilization of the food label.   Exercise Physiology & General Exercise Guidelines: - Group verbal and written instruction with models to review the exercise physiology of the cardiovascular system and associated critical values. Provides general exercise guidelines  with specific guidelines to those with heart or lung disease.    Aerobic  Exercise & Resistance Training: - Gives group verbal and written instruction on the various components of exercise. Focuses on aerobic and resistive training programs and the benefits of this training and how to safely progress through these programs..   Flexibility, Balance, Mind/Body Relaxation: Provides group verbal/written instruction on the benefits of flexibility and balance training, including mind/body exercise modes such as yoga, pilates and tai chi.  Demonstration and skill practice provided.   Stress and Anxiety: - Provides group verbal and written instruction about the health risks of elevated stress and causes of high stress.  Discuss the correlation between heart/lung disease and anxiety and treatment options. Review healthy ways to manage with stress and anxiety.   Depression: - Provides group verbal and written instruction on the correlation between heart/lung disease and depressed mood, treatment options, and the stigmas associated with seeking treatment.   Anatomy & Physiology of the Heart: - Group verbal and written instruction and models provide basic cardiac anatomy and physiology, with the coronary electrical and arterial systems. Review of Valvular disease and Heart Failure   Cardiac Procedures: - Group verbal and written instruction to review commonly prescribed medications for heart disease. Reviews the medication, class of the drug, and side effects. Includes the steps to properly store meds and maintain the prescription regimen. (beta blockers and nitrates)   Cardiac Medications I: - Group verbal and written instruction to review commonly prescribed medications for heart disease. Reviews the medication, class of the drug, and side effects. Includes the steps to properly store meds and maintain the prescription regimen.   Cardiac Medications II: -Group verbal and written instruction to  review commonly prescribed medications for heart disease. Reviews the medication, class of the drug, and side effects. (all other drug classes)    Go Sex-Intimacy & Heart Disease, Get SMART - Goal Setting: - Group verbal and written instruction through game format to discuss heart disease and the return to sexual intimacy. Provides group verbal and written material to discuss and apply goal setting through the application of the S.M.A.R.T. Method.   Other Matters of the Heart: - Provides group verbal, written materials and models to describe Stable Angina and Peripheral Artery. Includes description of the disease process and treatment options available to the cardiac patient.   Exercise & Equipment Safety: - Individual verbal instruction and demonstration of equipment use and safety with use of the equipment.   Cardiac Rehab from 10/30/2017 in [facility] Cardiac and Pulmonary Rehab  Date  10/30/17  Educator  [facility]  Instruction Review Code  1- Verbalizes Understanding      Infection Prevention: - Provides verbal and written material to individual with discussion of infection control including proper hand washing and proper equipment cleaning during exercise session.   Cardiac Rehab from 10/30/2017 in [facility] Cardiac and Pulmonary Rehab  Date  10/30/17  Educator  [facility]  Instruction Review Code  1- Verbalizes Understanding      Falls Prevention: - Provides verbal and written material to individual with discussion of falls prevention and safety.   Cardiac Rehab from 10/30/2017 in [facility] Cardiac and Pulmonary Rehab  Date  10/30/17  Educator  [facility]  Instruction Review Code  1- Verbalizes Understanding      Diabetes: - Individual verbal and written instruction to review signs/symptoms of diabetes, desired ranges of glucose level fasting, after meals and with exercise. Acknowledge that pre and post exercise glucose checks will be done for 3 sessions at entry of program.   Cardiac Rehab from 10/30/2017 in  [place] Cardiac and Pulmonary Rehab  Date  10/30/17  Educator  [facility]  Instruction Review Code  1- Verbalizes Understanding      Know Your Numbers and Risk Factors: -Group verbal and written instruction about important numbers in your health.  Discussion of what are risk factors and how they play a role in the disease process.  Review of Cholesterol, Blood Pressure, Diabetes, and BMI and the role they play in your overall health.   Sleep Hygiene: -Provides group verbal and written instruction about how sleep can affect your health.  Define sleep hygiene, discuss sleep cycles and impact of sleep habits. Review good sleep hygiene tips.    Other: -Provides group and verbal instruction on various topics (see comments)   Knowledge Questionnaire Score: Knowledge Questionnaire Score - 10/30/17 1347      Knowledge Questionnaire Score   Pre Score  25/28 correct answers reviewed with Reed       Core Components/Risk Factors/Patient Goals at Admission: Personal Goals and Risk Factors at Admission - 10/30/17 1322      Core Components/Risk Factors/Patient Goals on Admission    Weight Management  Yes;Weight Loss    Intervention  Weight Management: Develop a combined nutrition and exercise program designed to reach desired caloric intake, while maintaining appropriate intake of nutrient and fiber, sodium and fats, and appropriate energy expenditure required for the weight goal.;Weight Management/Obesity: Establish reasonable short term and long term weight goals.    Admit Weight  222 lb (100.7 kg)    Goal Weight: Short Term  217 lb (98.4 kg)    Goal Weight: Long Term  200 lb (90.7 kg)    Expected Outcomes  Short Term: Continue to assess and modify interventions until short term weight is achieved;Long Term: Adherence to nutrition and physical activity/exercise program aimed toward attainment of established weight goal;Weight Loss: Understanding of general recommendations for a balanced deficit meal  plan, which promotes 1-2 lb weight loss per week and includes a negative energy balance of [phone] kcal/d;Understanding recommendations for meals to include 15-35% energy as protein, 25-35% energy from fat, 35-60% energy from carbohydrates, less than 245m of dietary cholesterol, 20-35 gm of total fiber daily;Understanding of distribution of calorie intake throughout the day with the consumption of 4-5 meals/snacks    Diabetes  Yes    Intervention  Provide education about signs/symptoms and action to take for hypo/hyperglycemia.;Provide education about proper nutrition, including hydration, and aerobic/resistive exercise prescription along with prescribed medications to achieve blood glucose in normal ranges: Fasting glucose 65-99 mg/dL    Expected Outcomes  Short Term: Participant verbalizes understanding of the signs/symptoms and immediate care of hyper/hypoglycemia, proper foot care and importance of medication, aerobic/resistive exercise and nutrition plan for blood glucose control.;Long Term: Attainment of HbA1C < 7%.    Hypertension  Yes    Intervention  Provide education on lifestyle modifcations including regular physical activity/exercise, weight management, moderate sodium restriction and increased consumption of fresh fruit, vegetables, and low fat dairy, alcohol moderation, and smoking cessation.;Monitor prescription use compliance.    Expected Outcomes  Short Term: Continued assessment and intervention until BP is < 140/915mHG in hypertensive participants. < 130/8053mG in hypertensive participants with diabetes, heart failure or chronic kidney disease.;Long Term: Maintenance of blood pressure at goal levels.    Lipids  Yes    Intervention  Provide education and support for participant on nutrition & aerobic/resistive exercise along with prescribed medications to achieve LDL <68m6mDL >40mg12m Expected Outcomes  Short Term: Participant states understanding of desired cholesterol values and  is compliant with medications prescribed. Participant is following exercise prescription and nutrition guidelines.;Long Term: Cholesterol controlled with medications as prescribed, with individualized exercise RX and with personalized nutrition plan. Value goals: LDL < 69m, HDL > 40 mg.       Core Components/Risk Factors/Patient Goals Review:    Core Components/Risk Factors/Patient Goals at Discharge (Final Review):    ITP Comments: ITP Comments    Row Name 10/30/17 1308           ITP Comments  Med Review completed. Initial ITP created. Diagnosis can be found in Care Everywhere 10/06/17          Comments: Initial ITP

## 2017-10-30 NOTE — Progress Notes (Signed)
Daily Session Note  Patient Details  Name: Randy Luna MRN: [id number] Date of Birth: April 25, 1961 Referring Provider:     Cardiac Rehab from 10/30/2017 in [facility]  Referring Provider  Sabra Heck      Encounter Date: 10/30/2017  Check In: Session Check In - 10/30/17 1307      Check-In   Location  [facility]    Staff Present  Renita Papa, RN Vickki Hearing, BA, ACSM CEP, Exercise Physiologist    Supervising physician immediately available to respond to emergencies  See telemetry face sheet for immediately available ER MD    Medication changes reported      No    Fall or balance concerns reported     No    Warm-up and Cool-down  Performed as group-led instruction    Resistance Training Performed  Yes    VAD Patient?  No      Pain Assessment   Currently in Pain?  No/denies        Exercise Prescription Changes - 10/30/17 1300      Response to Exercise   Blood Pressure (Admit)  144/94    Blood Pressure (Exercise)  158/84    Blood Pressure (Exit)  142/84    Heart Rate (Admit)  88 bpm    Heart Rate (Exercise)  127 bpm    Heart Rate (Exit)  89 bpm    Oxygen Saturation (Admit)  99 %    Oxygen Saturation (Exit)  100 %    Rating of Perceived Exertion (Exercise)  12       Social History   Tobacco Use  Smoking Status Never Smoker  Smokeless Tobacco Never Used    Goals Met:  Proper associated with RPD/PD & O2 Sat Exercise tolerated well Personal goals reviewed No report of cardiac concerns or symptoms Strength training completed today  Goals Unmet:  Not Applicable  Comments: Med Review completed   Dr. Emily Filbert is Medical Director for [facility].

## 2017-11-04 ENCOUNTER — Telehealth: Payer: Self-pay | Admitting: *Deleted

## 2017-11-04 ENCOUNTER — Other Ambulatory Visit: Payer: Self-pay | Admitting: *Deleted

## 2017-11-04 DIAGNOSIS — D500 Iron deficiency anemia secondary to blood loss (chronic): Secondary | ICD-10-CM

## 2017-11-04 NOTE — Telephone Encounter (Signed)
Patient's wife stated that he was going to get his labs drawn from [facility] one day this week.

## 2017-11-05 ENCOUNTER — Encounter: Payer: Self-pay | Admitting: *Deleted

## 2017-11-05 ENCOUNTER — Telehealth: Payer: Self-pay | Admitting: *Deleted

## 2017-11-05 DIAGNOSIS — Z951 Presence of aortocoronary bypass graft: Secondary | ICD-10-CM

## 2017-11-05 NOTE — Telephone Encounter (Signed)
Called patient and LVM that his JaK 2 is negative for myeloproliferative disease.

## 2017-11-05 NOTE — Progress Notes (Signed)
Cardiac Individual Treatment Plan  Patient Details  Name: Randy Luna MRN: [id number] Date of Birth: August 08, 1960 Referring Provider:     Cardiac Rehab from 10/30/2017 in [facility] Cardiac and Pulmonary Rehab  Referring Provider  Randy Luna      Initial Encounter Date:    Cardiac [facility]/30/[facility]  Date  10/30/17  Referring Provider  Randy Luna      Visit Diagnosis: S/P CABG x 4  Patient's Home Medications on Admission:  Current Outpatient Medications:  .  allopurinol (ZYLOPRIM) 100 MG tablet, Take 1 tablet (100 mg total) by mouth daily. (Patient not taking: Reported on 10/30/2017), Disp: 30 tablet, Rfl: 0 .  allopurinol (ZYLOPRIM) 100 MG tablet, Take 100 mg by mouth 2 (two) times daily., Disp: , Rfl:  .  aspirin 325 MG tablet, Take 650 mg by mouth daily., Disp: , Rfl:  .  Cyanocobalamin (VITAMIN B12) 3000 MCG SUBL, Place 3,000 mcg under the tongue 2 (two) times a week., Disp: , Rfl:  .  HUMALOG KWIKPEN 100 UNIT/ML KiwkPen, Inject 12 Units into the skin 3 (three) times daily., Disp: , Rfl:  .  Iron-Vitamin C (VITRON-C) 65-125 MG TABS, Take 65 mg by mouth daily., Disp: , Rfl:  .  LANTUS SOLOSTAR 100 UNIT/ML Solostar Pen, Inject 24 Units into the skin at bedtime. , Disp: , Rfl:  .  metFORMIN (GLUCOPHAGE) 1000 MG tablet, Take 1,000 mg by mouth 2 (two) times daily with a meal. , Disp: , Rfl:  .  metoprolol tartrate (LOPRESSOR) 25 MG tablet, Take 25 mg by mouth daily., Disp: , Rfl:  .  nitroGLYCERIN (NITROGLYN) 2 % ointment, Apply 0.5 inches topically every 6 (six) hours. (Patient not taking: Reported on 10/30/2017), Disp: 30 g, Rfl: 0 .  ranitidine (ZANTAC) 150 MG tablet, Take 150 mg by mouth as needed., Disp: , Rfl:  .  rosuvastatin (CRESTOR) 20 MG tablet, Take 20 mg by mouth daily., Disp: , Rfl:   Past Medical History: Past Medical History:  Diagnosis Date  . Diabetes mellitus without complication ([place])   . Hypertension     Tobacco Use: Social History    Tobacco Use  Smoking Status Never Smoker  Smokeless Tobacco Never Used    Labs: Recent Review Scientist, physiological    Labs for ITP Cardiac and Pulmonary Rehab Latest Ref Rng & Units 12/03/2016 09/21/2017 09/22/2017   Cholestrol 0 - 200 mg/dL - - 237(H)   LDLCALC 0 - 99 mg/dL - - 141(H)   HDL >40 mg/dL - - 36(L)   Trlycerides <150 mg/dL - - 301(H)   Hemoglobin A1c 4.8 - 5.6 % 11.3(H) 10.6(H) -       Exercise Target Goals:    Exercise Program Goal: Individual exercise prescription set using results from initial 6 min walk test and THRR while considering  patient's activity barriers and safety.   Exercise Prescription Goal: Initial exercise prescription builds to 30-45 minutes a day of aerobic activity, 2-3 days per week.  Home exercise guidelines will be given to patient during program as part of exercise prescription that the participant will acknowledge.  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    Cardiac Risk Stratification  Moderate       6 Minute Walk: 6 Minute Walk  [place] Name 10/30/17 1355         6 Minute Walk   Distance  1186 feet     Walk Time  6 minutes     # of Rest Breaks  0     MPH  2.25     METS  3.43     RPE  12     Perceived Dyspnea   0     VO2 Peak  12.01     Symptoms  No     Resting HR  82 bpm     Resting BP  144/94     Resting Oxygen Saturation   99 %     Exercise Oxygen Saturation  during 6 min walk  100 %     Max Ex. HR  127 bpm     Max Ex. BP  158/84     2 Minute Post BP  142/86        Oxygen Initial Assessment:   Oxygen Re-Evaluation:   Oxygen Discharge (Final Oxygen Re-Evaluation):   Initial Exercise Prescription: Initial Exercise Prescription - 10/30/17 1300      Date of Initial Exercise RX and Referring  Provider   Date  10/30/17    Referring Provider  Randy Luna      Treadmill   MPH  2.5    Grade  1.5    Minutes  15    METs  3.43      Recumbant Bike   Level  6    RPM  60    Watts  40    Minutes  15    METs  3.4      T5 Nustep   Level  3    SPM  80    Minutes  15    METs  3.4      Prescription Details   Frequency (times per week)  3    Duration  Progress to 45 minutes of aerobic exercise without signs/symptoms of physical distress      Intensity   THRR 40-80% of Max Heartrate  114-148    Ratings of Perceived Exertion  11-13    Perceived Dyspnea  0-4      Resistance Training   Training Prescription  Yes    Weight  4 lb    Reps  10-15       Perform Capillary Blood Glucose checks as needed.  Exercise Prescription Changes: Exercise Prescription Changes    Row Name 10/30/17 1300             Response to Exercise   Blood Pressure (Admit)  144/94       Blood Pressure (Exercise)  158/84       Blood Pressure (Exit)  142/84       Heart Rate (Admit)  88 bpm       Heart Rate (Exercise)  127 bpm       Heart Rate (Exit)  89 bpm       Oxygen Saturation (Admit)  99 %       Oxygen Saturation (Exit)  100 %       Rating of Perceived Exertion (Exercise)  12          Exercise Comments:   Exercise Goals and Review: Exercise Goals    Row Name 10/30/17 1353             Exercise Goals   Increase Physical Activity  Yes       Intervention  Provide advice,  education, support and counseling about physical activity/exercise needs.;Develop an individualized exercise prescription for aerobic and resistive training based on initial evaluation findings, risk stratification, comorbidities and participant's personal goals.       Expected Outcomes  Short Term: Attend rehab on a regular basis to increase amount of physical activity.;Long Term: Add in home exercise to make exercise part of routine and to increase amount of physical activity.;Long Term: Exercising regularly at least  3-5 days a week.       Increase Strength and Stamina  Yes       Intervention  Provide advice, education, support and counseling about physical activity/exercise needs.;Develop an individualized exercise prescription for aerobic and resistive training based on initial evaluation findings, risk stratification, comorbidities and participant's personal goals.       Expected Outcomes  Short Term: Increase workloads from initial exercise prescription for resistance, speed, and METs.;Short Term: Perform resistance training exercises routinely during rehab and add in resistance training at home;Long Term: Improve cardiorespiratory fitness, muscular endurance and strength as measured by increased METs and functional capacity (6MWT)       Able to understand and use rate of perceived exertion (RPE) scale  Yes       Intervention  Provide education and explanation on how to use RPE scale       Expected Outcomes  Short Term: Able to use RPE daily in rehab to express subjective intensity level;Long Term:  Able to use RPE to guide intensity level when exercising independently       Able to understand and use Dyspnea scale  Yes       Intervention  Provide education and explanation on how to use Dyspnea scale       Expected Outcomes  Short Term: Able to use Dyspnea scale daily in rehab to express subjective sense of shortness of breath during exertion;Long Term: Able to use Dyspnea scale to guide intensity level when exercising independently       Knowledge and understanding of Target Heart Rate Range (THRR)  Yes       Intervention  Provide education and explanation of THRR including how the numbers were predicted and where they are located for reference       Expected Outcomes  Short Term: Able to state/look up THRR;Short Term: Able to use daily as guideline for intensity in rehab;Long Term: Able to use THRR to govern intensity when exercising independently       Able to check pulse independently  Yes        Intervention  Provide education and demonstration on how to check pulse in carotid and radial arteries.;Review the importance of being able to check your own pulse for safety during independent exercise       Expected Outcomes  Short Term: Able to explain why pulse checking is important during independent exercise;Long Term: Able to check pulse independently and accurately       Understanding of Exercise Prescription  Yes       Intervention  Provide education, explanation, and written materials on patient's individual exercise prescription       Expected Outcomes  Short Term: Able to explain program exercise prescription;Long Term: Able to explain home exercise prescription to exercise independently          Exercise Goals Re-Evaluation :   Discharge Exercise Prescription (Final Exercise Prescription Changes): Exercise Prescription Changes - 10/30/17 1300      Response to Exercise   Blood Pressure (Admit)  144/94  Blood Pressure (Exercise)  158/84    Blood Pressure (Exit)  142/84    Heart Rate (Admit)  88 bpm    Heart Rate (Exercise)  127 bpm    Heart Rate (Exit)  89 bpm    Oxygen Saturation (Admit)  99 %    Oxygen Saturation (Exit)  100 %    Rating of Perceived Exertion (Exercise)  12       Nutrition:  Target Goals: Understanding of nutrition guidelines, daily intake of sodium <1530m, cholesterol <2065m calories 30% from fat and 7% or less from saturated fats, daily to have 5 or more servings of fruits and vegetables.  Biometrics: Pre Biometrics - 10/30/17 1353      Pre Biometrics   Height  5' 6.5" (1.689 m)    Weight  232 lb 1.6 oz (105.3 kg)    Waist Circumference  43 inches    Hip Circumference  47 inches    Waist to Hip Ratio  0.91 %    BMI (Calculated)  36.91    Single Leg Stand  4.5 seconds        Nutrition Therapy Plan and Nutrition Goals: Nutrition Therapy & Goals - 10/30/17 1330      Intervention Plan   Intervention  Prescribe, educate and counsel  regarding individualized specific dietary modifications aiming towards targeted core components such as weight, hypertension, lipid management, diabetes, heart failure and other comorbidities.;Nutrition handout(s) given to patient.    Expected Outcomes  Short Term Goal: Understand basic principles of dietary content, such as calories, fat, sodium, cholesterol and nutrients.;Short Term Goal: A plan has been developed with personal nutrition goals set during dietitian appointment.;Long Term Goal: Adherence to prescribed nutrition plan.       Nutrition Assessments: Nutrition Assessments - 10/30/17 1330      MEDFICTS Scores   Pre Score  6       Nutrition Goals Re-Evaluation:   Nutrition Goals Discharge (Final Nutrition Goals Re-Evaluation):   Psychosocial: Target Goals: Acknowledge presence or absence of significant depression and/or stress, maximize coping skills, provide positive support system. Participant is able to verbalize types and ability to use techniques and skills needed for reducing stress and depression.   Initial Review & Psychosocial Screening: Initial Psych Review & Screening - 10/30/17 1334      Initial Review   Current issues with  Current Stress Concerns    Source of Stress Concerns  Unable to participate in former interests or hobbies;Unable to perform yard/household activities    Comments  Started back 1/2 days at work after surgery, about to start back full time. 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 He is still limited post surgery, but is anxious to get back to activities.      Family Dynamics   Good Support System?  Yes spouse      Barriers   Psychosocial barriers to participate in program  There are no identifiable barriers or psychosocial needs.;The patient should benefit from training in stress management and relaxation.      Screening Interventions    Interventions  Encouraged to exercise;Provide feedback about the scores to participant;Program counselor consult;To provide support and resources with identified psychosocial needs    Expected Outcomes  Short Term goal: Utilizing psychosocial counselor, staff and physician to assist with identification of specific Stressors or current issues interfering with healing process. Setting desired goal for each  stressor or current issue identified.;Long Term Goal: Stressors or current issues are controlled or eliminated.;Short Term goal: Identification and review with participant of any Quality of Life or Depression concerns found by scoring the questionnaire.;Long Term goal: The participant improves quality of Life and PHQ9 Scores as seen by post scores and/or verbalization of changes       Quality of Life Scores:  Quality of Life - 10/30/17 1340      Quality of Life Scores   Health/Function Pre  26.4 %    Socioeconomic Pre  29.25 %    Psych/Spiritual Pre  29.14 %    Family Pre  30 %    GLOBAL Pre  28.06 %      Scores of 19 and below usually indicate a poorer quality of life in these areas.  A difference of  2-3 points is a clinically meaningful difference.  A difference of 2-3 points in the total score of the Quality of Life Index has been associated with significant improvement in overall quality of life, self-image, physical symptoms, and general health in studies assessing change in quality of life.  PHQ-9: Recent Review Flowsheet Data    Depression screen [facility] 2/9 10/30/2017   Decreased Interest 0   Down, Depressed, Hopeless 0   PHQ - 2 Score 0   Altered sleeping 0   Tired, decreased energy 0   Change in appetite 0   Feeling bad or failure about yourself  0   Trouble concentrating 0   Moving slowly or fidgety/restless 0   Suicidal thoughts 0   PHQ-9 Score 0     Interpretation of Total Score  Total Score Depression Severity:  1-4 = Minimal depression, 5-9 = Mild depression, 10-14 =  Moderate depression, 15-19 = Moderately severe depression, 20-27 = Severe depression   Psychosocial Evaluation and Intervention:   Psychosocial Re-Evaluation:   Psychosocial Discharge (Final Psychosocial Re-Evaluation):   Vocational Rehabilitation: Provide vocational rehab assistance to qualifying candidates.   Vocational Rehab Evaluation & Intervention: Vocational Rehab - 10/30/17 1347      Initial Vocational Rehab Evaluation & Intervention   Assessment shows need for Vocational Rehabilitation  No       Education: Education Goals: Education classes will be provided on a variety of topics geared toward better understanding of heart health and risk factor modification. Participant will state understanding/return demonstration of topics presented as noted by education test scores.  Learning Barriers/Preferences: Learning Barriers/Preferences - 10/30/17 1346      Learning Barriers/Preferences   Learning Barriers  None    Learning Preferences  Verbal Instruction;Individual Instruction       Education Topics:  AED/CPR: - Group verbal and written instruction with the use of models to demonstrate the basic use of the AED with the basic ABC's of resuscitation.   General Nutrition Guidelines/Fats and Fiber: -Group instruction provided by verbal, written material, models and posters to present the general guidelines for heart healthy nutrition. Gives an explanation and review of dietary fats and fiber.   Controlling Sodium/Reading Food Labels: -Group verbal and written material supporting the discussion of sodium use in heart healthy nutrition. Review and explanation with models, verbal and written materials for utilization of the food label.   Exercise Physiology & General Exercise Guidelines: - Group verbal and written instruction with models to review the exercise physiology of the cardiovascular system and associated critical values. Provides general exercise guidelines  with specific guidelines to those with heart or lung disease.    Aerobic  Exercise & Resistance Training: - Gives group verbal and written instruction on the various components of exercise. Focuses on aerobic and resistive training programs and the benefits of this training and how to safely progress through these programs..   Flexibility, Balance, Mind/Body Relaxation: Provides group verbal/written instruction on the benefits of flexibility and balance training, including mind/body exercise modes such as yoga, pilates and tai chi.  Demonstration and skill practice provided.   Stress and Anxiety: - Provides group verbal and written instruction about the health risks of elevated stress and causes of high stress.  Discuss the correlation between heart/lung disease and anxiety and treatment options. Review healthy ways to manage with stress and anxiety.   Depression: - Provides group verbal and written instruction on the correlation between heart/lung disease and depressed mood, treatment options, and the stigmas associated with seeking treatment.   Anatomy & Physiology of the Heart: - Group verbal and written instruction and models provide basic cardiac anatomy and physiology, with the coronary electrical and arterial systems. Review of Valvular disease and Heart Failure   Cardiac Procedures: - Group verbal and written instruction to review commonly prescribed medications for heart disease. Reviews the medication, class of the drug, and side effects. Includes the steps to properly store meds and maintain the prescription regimen. (beta blockers and nitrates)   Cardiac Medications I: - Group verbal and written instruction to review commonly prescribed medications for heart disease. Reviews the medication, class of the drug, and side effects. Includes the steps to properly store meds and maintain the prescription regimen.   Cardiac Medications II: -Group verbal and written instruction to  review commonly prescribed medications for heart disease. Reviews the medication, class of the drug, and side effects. (all other drug classes)    Go Sex-Intimacy & Heart Disease, Get SMART - Goal Setting: - Group verbal and written instruction through game format to discuss heart disease and the return to sexual intimacy. Provides group verbal and written material to discuss and apply goal setting through the application of the S.M.A.R.T. Method.   Other Matters of the Heart: - Provides group verbal, written materials and models to describe Stable Angina and Peripheral Artery. Includes description of the disease process and treatment options available to the cardiac patient.   Exercise & Equipment Safety: - Individual verbal instruction and demonstration of equipment use and safety with use of the equipment.   Cardiac Rehab from 10/30/2017 in [facility] Cardiac and Pulmonary Rehab  Date  10/30/17  Educator  [facility]  Instruction Review Code  1- Verbalizes Understanding      Infection Prevention: - Provides verbal and written material to individual with discussion of infection control including proper hand washing and proper equipment cleaning during exercise session.   Cardiac Rehab from 10/30/2017 in [facility] Cardiac and Pulmonary Rehab  Date  10/30/17  Educator  [facility]  Instruction Review Code  1- Verbalizes Understanding      Falls Prevention: - Provides verbal and written material to individual with discussion of falls prevention and safety.   Cardiac Rehab from 10/30/2017 in [facility] Cardiac and Pulmonary Rehab  Date  10/30/17  Educator  [facility]  Instruction Review Code  1- Verbalizes Understanding      Diabetes: - Individual verbal and written instruction to review signs/symptoms of diabetes, desired ranges of glucose level fasting, after meals and with exercise. Acknowledge that pre and post exercise glucose checks will be done for 3 sessions at entry of program.   Cardiac Rehab from 10/30/2017 in  [place] Cardiac and Pulmonary Rehab  Date  10/30/17  Educator  [facility]  Instruction Review Code  1- Verbalizes Understanding      Know Your Numbers and Risk Factors: -Group verbal and written instruction about important numbers in your health.  Discussion of what are risk factors and how they play a role in the disease process.  Review of Cholesterol, Blood Pressure, Diabetes, and BMI and the role they play in your overall health.   Sleep Hygiene: -Provides group verbal and written instruction about how sleep can affect your health.  Define sleep hygiene, discuss sleep cycles and impact of sleep habits. Review good sleep hygiene tips.    Other: -Provides group and verbal instruction on various topics (see comments)   Knowledge Questionnaire Score: Knowledge Questionnaire Score - 10/30/17 1347      Knowledge Questionnaire Score   Pre Score  25/28 correct answers reviewed with Ashwath       Core Components/Risk Factors/Patient Goals at Admission: Personal Goals and Risk Factors at Admission - 10/30/17 1322      Core Components/Risk Factors/Patient Goals on Admission    Weight Management  Yes;Weight Loss    Intervention  Weight Management: Develop a combined nutrition and exercise program designed to reach desired caloric intake, while maintaining appropriate intake of nutrient and fiber, sodium and fats, and appropriate energy expenditure required for the weight goal.;Weight Management/Obesity: Establish reasonable short term and long term weight goals.    Admit Weight  222 lb (100.7 kg)    Goal Weight: Short Term  217 lb (98.4 kg)    Goal Weight: Long Term  200 lb (90.7 kg)    Expected Outcomes  Short Term: Continue to assess and modify interventions until short term weight is achieved;Long Term: Adherence to nutrition and physical activity/exercise program aimed toward attainment of established weight goal;Weight Loss: Understanding of general recommendations for a balanced deficit meal  plan, which promotes 1-2 lb weight loss per week and includes a negative energy balance of [phone] kcal/d;Understanding recommendations for meals to include 15-35% energy as protein, 25-35% energy from fat, 35-60% energy from carbohydrates, less than 235m of dietary cholesterol, 20-35 gm of total fiber daily;Understanding of distribution of calorie intake throughout the day with the consumption of 4-5 meals/snacks    Diabetes  Yes    Intervention  Provide education about signs/symptoms and action to take for hypo/hyperglycemia.;Provide education about proper nutrition, including hydration, and aerobic/resistive exercise prescription along with prescribed medications to achieve blood glucose in normal ranges: Fasting glucose 65-99 mg/dL    Expected Outcomes  Short Term: Participant verbalizes understanding of the signs/symptoms and immediate care of hyper/hypoglycemia, proper foot care and importance of medication, aerobic/resistive exercise and nutrition plan for blood glucose control.;Long Term: Attainment of HbA1C < 7%.    Hypertension  Yes    Intervention  Provide education on lifestyle modifcations including regular physical activity/exercise, weight management, moderate sodium restriction and increased consumption of fresh fruit, vegetables, and low fat dairy, alcohol moderation, and smoking cessation.;Monitor prescription use compliance.    Expected Outcomes  Short Term: Continued assessment and intervention until BP is < 140/972mHG in hypertensive participants. < 130/8025mG in hypertensive participants with diabetes, heart failure or chronic kidney disease.;Long Term: Maintenance of blood pressure at goal levels.    Lipids  Yes    Intervention  Provide education and support for participant on nutrition & aerobic/resistive exercise along with prescribed medications to achieve LDL <77m77mDL >40mg62m Expected Outcomes  Short Term: Participant states understanding of desired cholesterol values and  is compliant with medications prescribed. Participant is following exercise prescription and nutrition guidelines.;Long Term: Cholesterol controlled with medications as prescribed, with individualized exercise RX and with personalized nutrition plan. Value goals: LDL < 29m, HDL > 40 mg.       Core Components/Risk Factors/Patient Goals Review:    Core Components/Risk Factors/Patient Goals at Discharge (Final Review):    ITP Comments: ITP Comments    Row Name 10/30/17 1308 11/05/17 0638         ITP Comments  Med Review completed. Initial ITP created. Diagnosis can be found in Care Everywhere 10/06/17  30 day review comleted. Continue with ITP unless directed changes per Medical Director   New to program         Comments:

## 2017-11-06 ENCOUNTER — Encounter: Payer: Self-pay | Admitting: Urgent Care

## 2017-11-06 DIAGNOSIS — Z48812 Encounter for surgical aftercare following surgery on the circulatory system: Secondary | ICD-10-CM | POA: Diagnosis not present

## 2017-11-06 DIAGNOSIS — Z951 Presence of aortocoronary bypass graft: Secondary | ICD-10-CM

## 2017-11-06 LAB — GLUCOSE, CAPILLARY
Glucose-Capillary: 167 mg/dL — ABNORMAL HIGH (ref 65–99)
Glucose-Capillary: 106 mg/dL — ABNORMAL HIGH (ref 65–99)

## 2017-11-06 NOTE — Progress Notes (Signed)
Daily Session Note  Patient Details  Name: Randy Luna MRN: [id number] Date of Birth: 1960-12-14 Referring Provider:     Cardiac Rehab from 10/30/2017 in [facility] Cardiac and Pulmonary Rehab  Referring Provider  Sabra Heck      Encounter Date: 11/06/2017  Check In: Session Check In - 11/06/17 1649      Check-In   Location  [facility]-Cardiac & Pulmonary Rehab    Staff Present  Justin Mend Jaci Carrel, BS, ACSM CEP, Exercise Physiologist;Meredith Sherryll Burger, RN BSN    Supervising physician immediately available to respond to emergencies  See telemetry face sheet for immediately available ER MD    Medication changes reported      No    Fall or balance concerns reported     No    Tobacco Cessation  No Change    Warm-up and Cool-down  Performed on first and last piece of equipment    Resistance Training Performed  Yes    VAD Patient?  No      Pain Assessment   Currently in Pain?  No/denies          Social History   Tobacco Use  Smoking Status Never Smoker  Smokeless Tobacco Never Used    Goals Met:  Exercise tolerated well Personal goals reviewed No report of cardiac concerns or symptoms Strength training completed today  Goals Unmet:  Not Applicable  Comments: First full day of exercise!  Patient was oriented to gym and equipment including functions, settings, policies, and procedures.  Patient's individual exercise prescription and treatment plan were reviewed.  All starting workloads were established based on the results of the 6 minute walk test done at initial orientation visit.  The plan for exercise progression was also introduced and progression will be customized based on patient's performance and goals.   Dr. Emily Filbert is Medical Director for [place] and [facility].

## 2017-11-08 ENCOUNTER — Other Ambulatory Visit: Payer: Self-pay

## 2017-11-08 ENCOUNTER — Emergency Department: Payer: Managed Care, Other (non HMO)

## 2017-11-08 ENCOUNTER — Emergency Department
Admission: EM | Admit: 2017-11-08 | Discharge: 2017-11-08 | Disposition: A | Discharge status: Home / Self Care | Payer: Managed Care, Other (non HMO) | Attending: Emergency Medicine | Admitting: Emergency Medicine

## 2017-11-08 ENCOUNTER — Encounter: Payer: Self-pay | Admitting: Emergency Medicine

## 2017-11-08 DIAGNOSIS — Z7982 Long term (current) use of aspirin: Secondary | ICD-10-CM | POA: Diagnosis not present

## 2017-11-08 DIAGNOSIS — Z79899 Other long term (current) drug therapy: Secondary | ICD-10-CM | POA: Diagnosis not present

## 2017-11-08 DIAGNOSIS — Z794 Long term (current) use of insulin: Secondary | ICD-10-CM | POA: Diagnosis not present

## 2017-11-08 DIAGNOSIS — Z951 Presence of aortocoronary bypass graft: Secondary | ICD-10-CM | POA: Insufficient documentation

## 2017-11-08 DIAGNOSIS — E119 Type 2 diabetes mellitus without complications: Secondary | ICD-10-CM | POA: Insufficient documentation

## 2017-11-08 DIAGNOSIS — I10 Essential (primary) hypertension: Secondary | ICD-10-CM | POA: Diagnosis not present

## 2017-11-08 DIAGNOSIS — Z041 Encounter for examination and observation following transport accident: Secondary | ICD-10-CM | POA: Insufficient documentation

## 2017-11-08 DIAGNOSIS — I252 Old myocardial infarction: Secondary | ICD-10-CM | POA: Diagnosis not present

## 2017-11-08 DIAGNOSIS — E785 Hyperlipidemia, unspecified: Secondary | ICD-10-CM | POA: Diagnosis not present

## 2017-11-08 MED ORDER — CYCLOBENZAPRINE HCL 10 MG PO TABS: 10.0000 mg | ORAL_TABLET | Freq: Three times a day (TID) | ORAL | 0 refills | Status: AC | PRN

## 2017-11-08 MED ORDER — TRAMADOL HCL 50 MG PO TABS: 50.0000 mg | ORAL_TABLET | Freq: Two times a day (BID) | ORAL | 0 refills | Status: DC | PRN
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
## 2017-11-08 NOTE — ED Notes (Signed)
Patient transported to X-ray 

## 2017-11-08 NOTE — ED Provider Notes (Signed)
ED ECG REPORT I, Doran Stabler, the attending physician, personally viewed and interpreted this ECG.   Date: 11/08/2017  EKG Time: 1219  Rate: 88  Rhythm: normal sinus rhythm  Axis: Normal  Intervals:none  ST&T Change: No ST segment elevation or depression.  Single T wave inversion in V2, likely positional.    Orbie Pyo, MD 11/08/17 [phone]

## 2017-11-10 ENCOUNTER — Encounter: Payer: Managed Care, Other (non HMO) | Admitting: *Deleted

## 2017-11-10 DIAGNOSIS — Z951 Presence of aortocoronary bypass graft: Secondary | ICD-10-CM

## 2017-11-10 DIAGNOSIS — Z48812 Encounter for surgical aftercare following surgery on the circulatory system: Secondary | ICD-10-CM | POA: Diagnosis not present

## 2017-11-10 LAB — GLUCOSE, CAPILLARY: GLUCOSE-CAPILLARY: 91 mg/dL (ref 65–99)

## 2017-11-10 NOTE — Progress Notes (Signed)
Daily Session Note  Patient Details  Name: Randy Luna MRN: [id number] Date of Birth: 10-Sep-1960 Referring Provider:     Cardiac Rehab from 10/30/2017 in [facility] Cardiac and Pulmonary Rehab  Referring Provider  Sabra Heck      Encounter Date: 11/10/2017  Check In: Session Check In - 11/10/17 1744      Check-In   Location  [facility]-Cardiac [facility]monary Re[facility]    Staff Present  Renita Papa, RN Moises Blood, BS, ACSM CEP, Exercise Physiologist;Carroll Enterkin, RN, BSN    Supervising physician immediately available to respond to emergencies  See telemetry face sheet for immediately available ER MD    Medication changes reported      No    Fall or balance concerns reported     No    Tobacco Cessation  No Change    Warm-up and Cool-down  Performed on first and last piece of equipment    Resistance Training Performed  Yes    VAD Patient?  No      Pain Assessment   Currently in Pain?  No/denies    Multiple Pain Sites  No          Social History   Tobacco Use  Smoking Status Never Smoker  Smokeless Tobacco Never Used    Goals Met:  Independence with exercise equipment Exercise tolerated well No report of cardiac concerns or symptoms Strength training completed today  Goals Unmet:  Not Applicable  Comments: Pt able to follow exercise prescription today without complaint.  Will continue to monitor for progression.    Dr. Emily Filbert is Medical Director for [place] and [facility].

## 2017-11-11 ENCOUNTER — Encounter: Payer: Self-pay | Admitting: Hematology and Oncology

## 2017-11-11 LAB — GLUCOSE, CAPILLARY: GLUCOSE-CAPILLARY: 174 mg/dL — AB (ref 65–99)

## 2017-11-12 ENCOUNTER — Encounter: Payer: Self-pay | Admitting: Urgent Care

## 2017-11-12 ENCOUNTER — Telehealth: Payer: Self-pay | Admitting: *Deleted

## 2017-11-12 ENCOUNTER — Encounter: Payer: Managed Care, Other (non HMO) | Attending: Internal Medicine | Admitting: *Deleted

## 2017-11-12 DIAGNOSIS — I10 Essential (primary) hypertension: Secondary | ICD-10-CM | POA: Insufficient documentation

## 2017-11-12 DIAGNOSIS — E119 Type 2 diabetes mellitus without complications: Secondary | ICD-10-CM | POA: Diagnosis not present

## 2017-11-12 DIAGNOSIS — Z7984 Long term (current) use of oral hypoglycemic drugs: Secondary | ICD-10-CM | POA: Insufficient documentation

## 2017-11-12 DIAGNOSIS — Z951 Presence of aortocoronary bypass graft: Secondary | ICD-10-CM

## 2017-11-12 DIAGNOSIS — Z79899 Other long term (current) drug therapy: Secondary | ICD-10-CM | POA: Insufficient documentation

## 2017-11-12 DIAGNOSIS — Z48812 Encounter for surgical aftercare following surgery on the circulatory system: Secondary | ICD-10-CM | POA: Insufficient documentation

## 2017-11-12 LAB — GLUCOSE, CAPILLARY
GLUCOSE-CAPILLARY: 101 mg/dL — AB (ref 65–99)
Glucose-Capillary: 111 mg/dL — ABNORMAL HIGH (ref 65–99)

## 2017-11-12 NOTE — Progress Notes (Signed)
Daily Session Note  Patient Details  Name: ROBIE MCNIEL MRN: [id number] Date of Birth: [birth year] Referring Provider:     Cardiac Rehab from 10/30/2017 in [facility] Cardiac and Pulmonary Rehab  Referring Provider  Sabra Heck      Encounter Date: 11/12/2017  Check In: Session Check In - 11/12/17 1632      Check-In   Location  [facility]    Staff Present  Renita Papa, RN Vickki Hearing, BA, ACSM CEP, Exercise Physiologist;Carroll Enterkin, RN, BSN    Supervising physician immediately available to respond to emergencies  See telemetry face sheet for immediately available ER MD    Medication changes reported      No    Fall or balance concerns reported     No    Tobacco Cessation  No Change    Warm-up and Cool-down  Performed on first and last piece of equipment    Resistance Training Performed  Yes    VAD Patient?  No      Pain Assessment   Currently in Pain?  No/denies          Social History   Tobacco Use  Smoking Status Never Smoker  Smokeless Tobacco Never Used    Goals Met:  Independence with exercise equipment Exercise tolerated well No report of cardiac concerns or symptoms Strength training completed today  Goals Unmet:  Not Applicable  Comments: Pt able to follow exercise prescription today without complaint.  Will continue to monitor for progression.    Dr. Emily Filbert is Medical Director for [facility] and [facility].

## 2017-11-12 NOTE — Telephone Encounter (Signed)
Patient called back to inform us that he plans to got to [facility] on Friday to have his labs redrawn.

## 2017-11-12 NOTE — Telephone Encounter (Signed)
Called patient to advise patient to go back to [facility] to have repeat CBC with diff.  Per [facility] this morning the last lab was drawn on 10-15-17.  Dr Mike Gip had ordered a repeat draw for comparison.  Asked patient to call us to let us know when he goes for next lab.

## 2017-11-17 DIAGNOSIS — Z951 Presence of aortocoronary bypass graft: Secondary | ICD-10-CM

## 2017-11-17 DIAGNOSIS — Z48812 Encounter for surgical aftercare following surgery on the circulatory system: Secondary | ICD-10-CM | POA: Diagnosis not present

## 2017-11-17 NOTE — Progress Notes (Signed)
Daily Session Note  Patient Details  Name: Randy Luna MRN: [id number] Date of Birth: 04-05-1961 Referring Provider:     Cardiac Rehab from 10/30/2017 in [facility] Cardiac and Pulmonary Rehab  Referring Provider  Sabra Heck      Encounter Date: 11/17/2017  Check In: Session Check In - 11/17/17 1711      Check-In   Location  [facility]    Staff Present  Earlean Shawl, BS, ACSM CEP, Exercise Physiologist;Dorna Mallet Oletta Darter, BA, ACSM CEP, Exercise Physiologist;Carroll Enterkin, RN, BSN    Supervising physician immediately available to respond to emergencies  See telemetry face sheet for immediately available ER MD    Medication changes reported      No    Fall or balance concerns reported     No    Warm-up and Cool-down  Performed on first and last piece of equipment    Resistance Training Performed  Yes    VAD Patient?  No      Pain Assessment   Currently in Pain?  No/denies    Multiple Pain Sites  No          Social History   Tobacco Use  Smoking Status Never Smoker  Smokeless Tobacco Never Used    Goals Met:  Independence with exercise equipment Exercise tolerated well No report of cardiac concerns or symptoms Strength training completed today  Goals Unmet:  Not Applicable  Comments: Pt able to follow exercise prescription today without complaint.  Will continue to monitor for progression.    Dr. Emily Filbert is Medical Director for [place] and [facility].

## 2017-11-20 DIAGNOSIS — Z48812 Encounter for surgical aftercare following surgery on the circulatory system: Secondary | ICD-10-CM | POA: Diagnosis not present

## 2017-11-20 DIAGNOSIS — Z951 Presence of aortocoronary bypass graft: Secondary | ICD-10-CM

## 2017-11-20 NOTE — Progress Notes (Signed)
Daily Session Note  Patient Details  Name: Randy Luna MRN: [id number] Date of Birth: [birth year] Referring Provider:     Cardiac Rehab from 10/30/2017 in [facility] Cardiac and Pulmonary Rehab  Referring Provider  Sabra Heck      Encounter Date: 11/20/2017  Check In: Session Check In - 11/20/17 1651      Check-In   Location  [facility]    Staff Present  Earlean Shawl, BS, ACSM CEP, Exercise Physiologist;Meredith Sherryll Burger, RN BSN;Jaris Kohles Flavia Shipper    Supervising physician immediately available to respond to emergencies  See telemetry face sheet for immediately available ER MD    Medication changes reported      No    Fall or balance concerns reported     No    Tobacco Cessation  No Change    Warm-up and Cool-down  Performed on first and last piece of equipment    Resistance Training Performed  Yes    VAD Patient?  No      Pain Assessment   Currently in Pain?  No/denies          Social History   Tobacco Use  Smoking Status Never Smoker  Smokeless Tobacco Never Used    Goals Met:  Independence with exercise equipment Exercise tolerated well Personal goals reviewed No report of cardiac concerns or symptoms Strength training completed today  Goals Unmet:  Not Applicable  Comments: Reviewed home exercise with pt today.  Pt plans to walk on the treadmill at home 1-2 times a week for exercise.  Reviewed THR, pulse, RPE, sign and symptoms, NTG use, and when to call 911 or MD.  Also discussed weather considerations and indoor options.  Pt voiced understanding.   Dr. Emily Filbert is Medical Director for [place] and [facility].

## 2017-11-24 ENCOUNTER — Encounter: Payer: Managed Care, Other (non HMO) | Admitting: *Deleted

## 2017-11-24 DIAGNOSIS — Z951 Presence of aortocoronary bypass graft: Secondary | ICD-10-CM

## 2017-11-24 DIAGNOSIS — Z48812 Encounter for surgical aftercare following surgery on the circulatory system: Secondary | ICD-10-CM | POA: Diagnosis not present

## 2017-11-24 NOTE — Progress Notes (Signed)
Daily Session Note  Patient Details  Name: Randy Luna MRN: [id number] Date of Birth: [birth year] Referring Provider:     Cardiac Rehab from 10/30/2017 in [facility] Cardiac and Pulmonary Rehab  Referring Provider  Sabra Heck      Encounter Date: 11/24/2017  Check In: Session Check In - 11/24/17 1626      Check-In   Location  [facility]    Staff Present  Gerlene Burdock, RN, Moises Blood, BS, ACSM CEP, Exercise Physiologist;Amanda Oletta Darter, [place], ACSM CEP, Exercise Physiologist    Supervising physician immediately available to respond to emergencies  See telemetry face sheet for immediately available ER MD    Medication changes reported      No    Fall or balance concerns reported     No    Warm-up and Cool-down  Performed on first and last piece of equipment    Resistance Training Performed  Yes    VAD Patient?  No      Pain Assessment   Currently in Pain?  No/denies    Multiple Pain Sites  No          Social History   Tobacco Use  Smoking Status Never Smoker  Smokeless Tobacco Never Used    Goals Met:  Independence with exercise equipment Exercise tolerated well No report of cardiac concerns or symptoms Strength training completed today  Goals Unmet:  Not Applicable  Comments: Pt able to follow exercise prescription today without complaint.  Will continue to monitor for progression.    Dr. Emily Filbert is Medical Director for [place] and [facility].

## 2017-11-26 ENCOUNTER — Encounter: Payer: Managed Care, Other (non HMO) | Admitting: *Deleted

## 2017-11-26 DIAGNOSIS — Z48812 Encounter for surgical aftercare following surgery on the circulatory system: Secondary | ICD-10-CM | POA: Diagnosis not present

## 2017-11-26 DIAGNOSIS — Z951 Presence of aortocoronary bypass graft: Secondary | ICD-10-CM

## 2017-11-26 LAB — GLUCOSE, CAPILLARY: GLUCOSE-CAPILLARY: 147 mg/dL — AB (ref 65–99)

## 2017-11-26 NOTE — Progress Notes (Signed)
Daily Session Note  Patient Details  Name: Randy Luna MRN: [id number] Date of Birth: December 26, 1960 Referring Provider:     Cardiac Rehab from 10/30/2017 in [facility] Cardiac and Pulmonary Rehab  Referring Provider  Sabra Heck      Encounter Date: 11/26/2017  Check In: Session Check In - 11/26/17 1636      Check-In   Location  [facility]    Staff Present  Gerlene Burdock, RN, BSN;Suzanne Kho Sherryll Burger, RN Vickki Hearing, BA, ACSM CEP, Exercise Physiologist    Supervising physician immediately available to respond to emergencies  See telemetry face sheet for immediately available ER MD    Medication changes reported      No    Fall or balance concerns reported     No    Tobacco Cessation  No Change    Warm-up and Cool-down  Performed on first and last piece of equipment    Resistance Training Performed  Yes    VAD Patient?  No      Pain Assessment   Currently in Pain?  No/denies          Social History   Tobacco Use  Smoking Status Never Smoker  Smokeless Tobacco Never Used    Goals Met:  Independence with exercise equipment Exercise tolerated well No report of cardiac concerns or symptoms Strength training completed today  Goals Unmet:  Not Applicable  Comments: Pt able to follow exercise prescription today without complaint.  Will continue to monitor for progression.    Dr. Emily Filbert is Medical Director for [place] and [facility].

## 2017-11-27 ENCOUNTER — Encounter: Payer: Self-pay | Admitting: Dietician

## 2017-11-27 ENCOUNTER — Encounter: Payer: Managed Care, Other (non HMO) | Admitting: *Deleted

## 2017-11-27 DIAGNOSIS — Z48812 Encounter for surgical aftercare following surgery on the circulatory system: Secondary | ICD-10-CM | POA: Diagnosis not present

## 2017-11-27 DIAGNOSIS — Z951 Presence of aortocoronary bypass graft: Secondary | ICD-10-CM

## 2017-11-27 LAB — GLUCOSE, CAPILLARY
GLUCOSE-CAPILLARY: 59 mg/dL — AB (ref 65–99)
GLUCOSE-CAPILLARY: 60 mg/dL — AB (ref 65–99)
GLUCOSE-CAPILLARY: 101 mg/dL — AB (ref 65–99)

## 2017-11-27 NOTE — Progress Notes (Signed)
Incomplete Session Note  Patient Details  Name: Randy Luna MRN: [id number] Date of Birth: [birth year] Referring Provider:     Cardiac Rehab from 10/30/2017 in [facility] Cardiac and Pulmonary Rehab  Referring Provider  [name] did not complete his rehab session.   [name]'s blood glucose check was low when he arrived to class. 59mg /Dl.  He was treated per protocol and blood glucose raised to 60mg /Dl, treatment repeated and blood glucose raised to 101 mg/Dl.  This is not the first low blood glucose reading for [name] since he has changed his eating habits and started the program.  Have advised [name] to call his PMD to review his blood glucose levels and meds.

## 2017-12-03 ENCOUNTER — Encounter: Payer: Self-pay | Admitting: *Deleted

## 2017-12-03 DIAGNOSIS — Z951 Presence of aortocoronary bypass graft: Secondary | ICD-10-CM

## 2017-12-03 DIAGNOSIS — Z48812 Encounter for surgical aftercare following surgery on the circulatory system: Secondary | ICD-10-CM | POA: Diagnosis not present

## 2017-12-03 NOTE — Progress Notes (Signed)
Daily Session Note  Patient Details  Name: Randy Luna MRN: [id number] Date of Birth: 1961-05-04 Referring Provider:     Cardiac Rehab from 10/30/2017 in [facility] Cardiac and Pulmonary Rehab  Referring Provider  Sabra Heck      Encounter Date: 12/03/2017  Check In: Session Check In - 12/03/17 1744      Check-In   Location  [facility]    Staff Present  Gerlene Burdock, RN, BSN;Meredith Sherryll Burger, RN Vickki Hearing, BA, ACSM CEP, Exercise Physiologist    Supervising physician immediately available to respond to emergencies  See telemetry face sheet for immediately available ER MD    Medication changes reported      No    Fall or balance concerns reported     No    Warm-up and Cool-down  Performed on first and last piece of equipment    Resistance Training Performed  Yes    VAD Patient?  No      Pain Assessment   Currently in Pain?  No/denies    Multiple Pain Sites  No          Social History   Tobacco Use  Smoking Status Never Smoker  Smokeless Tobacco Never Used    Goals Met:  Independence with exercise equipment Exercise tolerated well No report of cardiac concerns or symptoms Strength training completed today  Goals Unmet:  Not Applicable  Comments: Pt able to follow exercise prescription today without complaint.  Will continue to monitor for progression.    Dr. Emily Filbert is Medical Director for [place] and [facility].

## 2017-12-03 NOTE — Progress Notes (Signed)
Cardiac Individual Treatment Plan  Patient Details  Name: Randy Luna MRN: [id number] Date of Birth: [birth year] Referring Provider:     Cardiac Rehab from 10/30/2017 in [facility] Cardiac and Pulmonary Rehab  Referring Provider  Randy Luna      Initial Encounter Date:    Cardiac Rehab from 10/30/2017 in [facility] Cardiac and Pulmonary Rehab  Date  10/30/17  Referring Provider  Randy Luna      Visit Diagnosis: S/P CABG x 4  Patient's Home Medications on Admission:  Current Outpatient Medications:  .  allopurinol (ZYLOPRIM) 100 MG tablet, Take 1 tablet (100 mg total) by mouth daily. (Patient not taking: Reported on 10/30/2017), Disp: 30 tablet, Rfl: 0 .  allopurinol (ZYLOPRIM) 100 MG tablet, Take 100 mg by mouth 2 (two) times daily., Disp: , Rfl:  .  aspirin 325 MG tablet, Take 650 mg by mouth daily., Disp: , Rfl:  .  Cyanocobalamin (VITAMIN B12) 3000 MCG SUBL, Place 3,000 mcg under the tongue 2 (two) times a week., Disp: , Rfl:  .  cyclobenzaprine (FLEXERIL) 10 MG tablet, Take 1 tablet (10 mg total) by mouth 3 (three) times daily as needed., Disp: 15 tablet, Rfl: 0 .  HUMALOG KWIKPEN 100 UNIT/ML KiwkPen, Inject 12 Units into the skin 3 (three) times daily., Disp: , Rfl:  .  Iron-Vitamin C (VITRON-C) 65-125 MG TABS, Take 65 mg by mouth daily., Disp: , Rfl:  .  LANTUS SOLOSTAR 100 UNIT/ML Solostar Pen, Inject 24 Units into the skin at bedtime. , Disp: , Rfl:  .  metFORMIN (GLUCOPHAGE) 1000 MG tablet, Take 1,000 mg by mouth 2 (two) times daily with a meal. , Disp: , Rfl:  .  metoprolol tartrate (LOPRESSOR) 25 MG tablet, Take 25 mg by mouth daily., Disp: , Rfl:  .  nitroGLYCERIN (NITROGLYN) 2 % ointment, Apply 0.5 inches topically every 6 (six) hours. (Patient not taking: Reported on 10/30/2017), Disp: 30 g, Rfl: 0 .  ranitidine (ZANTAC) 150 MG tablet, Take 150 mg by mouth as needed., Disp: , Rfl:  .  rosuvastatin (CRESTOR) 20 MG tablet, Take 20 mg by mouth daily., Disp: , Rfl:  .  traMADol (ULTRAM) 50 MG  tablet, Take 1 tablet (50 mg total) by mouth every 12 (twelve) hours as needed., Disp: 12 tablet, Rfl: 0  Past Medical History: Past Medical History:  Diagnosis Date  . Diabetes mellitus without complication ([place])   . Hypertension     Tobacco Use: Social History   Tobacco Use  Smoking Status Never Smoker  Smokeless Tobacco Never Used    Labs: Recent Review Scientist, physiological    Labs for ITP Cardiac and Pulmonary Rehab Latest Ref Rng & Units 12/03/2016 09/21/2017 09/22/2017   Cholestrol 0 - 200 mg/dL - - 237(H)   LDLCALC 0 - 99 mg/dL - - 141(H)   HDL >40 mg/dL - - 36(L)   Trlycerides <150 mg/dL - - 301(H)   Hemoglobin A1c 4.8 - 5.6 % 11.3(H) 10.6(H) -       Exercise Target Goals:    Exercise Program Goal: Individual exercise prescription set using results from initial 6 min walk test and THRR while considering  patient's activity barriers and safety.   Exercise Prescription Goal: Initial exercise prescription builds to 30-45 minutes a day of aerobic activity, 2-3 days per week.  Home exercise guidelines will be given to patient during program as part of exercise prescription that the participant will acknowledge.  Activity Barriers & Risk Stratification: Activity Barriers & Cardiac Risk Stratification -  10/30/17 1348      Activity Barriers & Cardiac Risk Stratification   Activity Barriers  Joint Problems Randy Luna struggles with gout, that usually affects his feet and knees.    Cardiac Risk Stratification  Moderate       6 Minute Walk: 6 Minute Walk    Row Name 10/30/17 1355         6 Minute Walk   Distance  1186 feet     Walk Time  6 minutes     # of Rest Breaks  0     MPH  2.25     METS  3.43     RPE  12     Perceived Dyspnea   0     VO2 Peak  12.01     Symptoms  No     Resting HR  82 bpm     Resting BP  144/94     Resting Oxygen Saturation   99 %     Exercise Oxygen Saturation  during 6 min walk  100 %     Max Ex. HR  127 bpm     Max Ex. BP  158/84     2  Minute Post BP  142/86        Oxygen Initial Assessment:   Oxygen Re-Evaluation:   Oxygen Discharge (Final Oxygen Re-Evaluation):   Initial Exercise Prescription: Initial Exercise Prescription - 10/30/17 1300      Date of Initial Exercise RX and Referring Provider   Date  10/30/17    Referring Provider  Randy Luna      Treadmill   MPH  2.5    Grade  1.5    Minutes  15    METs  3.43      Recumbant Bike   Level  6    RPM  60    Watts  40    Minutes  15    METs  3.4      T5 Nustep   Level  3    SPM  80    Minutes  15    METs  3.4      Prescription Details   Frequency (times per week)  3    Duration  Progress to 45 minutes of aerobic exercise without signs/symptoms of physical distress      Intensity   THRR 40-80% of Max Heartrate  114-148    Ratings of Perceived Exertion  11-13    Perceived Dyspnea  0-4      Resistance Training   Training Prescription  Yes    Weight  4 lb    Reps  10-15       Perform Capillary Blood Glucose checks as needed.  Exercise Prescription Changes: Exercise Prescription Changes    Row Name 10/30/17 1300 11/12/17 1600 11/20/17 1700 11/25/17 1500 11/25/17 1600     Response to Exercise   Blood Pressure (Admit)  144/94  138/70  -  148/80  -   Blood Pressure (Exercise)  158/84  152/78  -  162/84  -   Blood Pressure (Exit)  142/84  132/84  -  148/82  -   Heart Rate (Admit)  88 bpm  80 bpm  -  87 bpm  -   Heart Rate (Exercise)  127 bpm  123 bpm  -  118 bpm  -   Heart Rate (Exit)  89 bpm  103 bpm  -  95 bpm  -   Oxygen Saturation (Admit)  99 %  -  -  -  -   Oxygen Saturation (Exit)  100 %  -  -  -  -   Rating of Perceived Exertion (Exercise)  12  12  -  12  -   Symptoms  -  none  -  none  -   Duration  -  Continue with 45 min of aerobic exercise without signs/symptoms of physical distress.  -  Continue with 45 min of aerobic exercise without signs/symptoms of physical distress.  -   Intensity  -  THRR unchanged  -  THRR unchanged  -      Progression   Progression  -  Continue to progress workloads to maintain intensity without signs/symptoms of physical distress.  -  Continue to progress workloads to maintain intensity without signs/symptoms of physical distress.  -   Average METs  -  2.4  -  3.3  -     Resistance Training   Training Prescription  -  Yes  -  Yes  -   Weight  -  4 lb  -  4 lb  -   Reps  -  10-15  -  10-15  -     Treadmill   MPH  -  1.5  -  2  -   Grade  -  1.5  -  1.5  -   Minutes  -  15  -  15  -   METs  -  2.35  -  2.95  -     Recumbant Bike   Level  -  -  -  6  -   RPM  -  -  -  60  -   Watts  -  -  -  54  -   Minutes  -  -  -  15  -   METs  -  -  -  3.6  -     T5 Nustep   Level  -  3  -  -  -   SPM  -  80  -  -  -   Minutes  -  15  -  -  -   METs  -  2.4  -  -  -     Home Exercise Plan   Plans to continue exercise at  -  -  Home (comment) walking at home  -  Home (comment) walking at home   Frequency  -  -  Add 2 additional days to program exercise sessions.  -  Add 2 additional days to program exercise sessions.   Initial Home Exercises Provided  -  -  11/20/17  -  11/20/17      Exercise Comments: Exercise Comments    Row Name 11/06/17 1650           Exercise Comments  First full day of exercise!  Patient was oriented to gym and equipment including functions, settings, policies, and procedures.  Patient's individual exercise prescription and treatment plan were reviewed.  All starting workloads were established based on the results of the 6 minute walk test done at initial orientation visit.  The plan for exercise progression was also introduced and progression will be customized based on patient's performance and goals.          Exercise Goals and Review: Exercise Goals    Row Name 10/30/17 1353  Exercise Goals   Increase Physical Activity  Yes       Intervention  Provide advice, education, support and counseling about physical activity/exercise  needs.;Develop an individualized exercise prescription for aerobic and resistive training based on initial evaluation findings, risk stratification, comorbidities and participant's personal goals.       Expected Outcomes  Short Term: Attend rehab on a regular basis to increase amount of physical activity.;Long Term: Add in home exercise to make exercise part of routine and to increase amount of physical activity.;Long Term: Exercising regularly at least 3-5 days a week.       Increase Strength and Stamina  Yes       Intervention  Provide advice, education, support and counseling about physical activity/exercise needs.;Develop an individualized exercise prescription for aerobic and resistive training based on initial evaluation findings, risk stratification, comorbidities and participant's personal goals.       Expected Outcomes  Short Term: Increase workloads from initial exercise prescription for resistance, speed, and METs.;Short Term: Perform resistance training exercises routinely during rehab and add in resistance training at home;Long Term: Improve cardiorespiratory fitness, muscular endurance and strength as measured by increased METs and functional capacity (6MWT)       Able to understand and use rate of perceived exertion (RPE) scale  Yes       Intervention  Provide education and explanation on how to use RPE scale       Expected Outcomes  Short Term: Able to use RPE daily in rehab to express subjective intensity level;Long Term:  Able to use RPE to guide intensity level when exercising independently       Able to understand and use Dyspnea scale  Yes       Intervention  Provide education and explanation on how to use Dyspnea scale       Expected Outcomes  Short Term: Able to use Dyspnea scale daily in rehab to express subjective sense of shortness of breath during exertion;Long Term: Able to use Dyspnea scale to guide intensity level when exercising independently       Knowledge and  understanding of Target Heart Rate Range (THRR)  Yes       Intervention  Provide education and explanation of THRR including how the numbers were predicted and where they are located for reference       Expected Outcomes  Short Term: Able to state/look up THRR;Short Term: Able to use daily as guideline for intensity in rehab;Long Term: Able to use THRR to govern intensity when exercising independently       Able to check pulse independently  Yes       Intervention  Provide education and demonstration on how to check pulse in carotid and radial arteries.;Review the importance of being able to check your own pulse for safety during independent exercise       Expected Outcomes  Short Term: Able to explain why pulse checking is important during independent exercise;Long Term: Able to check pulse independently and accurately       Understanding of Exercise Prescription  Yes       Intervention  Provide education, explanation, and written materials on patient's individual exercise prescription       Expected Outcomes  Short Term: Able to explain program exercise prescription;Long Term: Able to explain home exercise prescription to exercise independently          Exercise Goals Re-Evaluation : Exercise Goals Re-Evaluation    Row Name 11/06/17 1651 11/12/17 1638 11/20/17 1653  11/25/17 1601       Exercise Goal Re-Evaluation   Exercise Goals Review  Understanding of Exercise Prescription;Knowledge and understanding of Target Heart Rate Range (THRR);Able to understand and use rate of perceived exertion (RPE) scale  Increase Physical Activity;Increase Strength and Stamina;Able to understand and use rate of perceived exertion (RPE) scale;Knowledge and understanding of Target Heart Rate Range (THRR)  Increase Physical Activity;Increase Strength and Stamina  Increase Physical Activity;Able to understand and use rate of perceived exertion (RPE) scale;Knowledge and understanding of Target Heart Rate Range  (THRR);Increase Strength and Stamina    Comments  Reviewed RPE scale, THR and program prescription with pt today.  Pt voiced understanding and was given a copy of goals to take home.   Pt is tolerating exercise well.  Staff will monitor progress  Reviewed home exercise with pt today.  Pt plans to walk on the treadmill at home 1-2 times a week for exercise.  Reviewed THR, pulse, RPE, sign and symptoms, NTG use, and when to call 911 or MD.  Also discussed weather considerations and indoor options.  Pt voiced understanding.  Sohum is porgressing well with exercise and improved MET level.  Staff will monitor.    Expected Outcomes  Short: Use RPE daily to regulate intensity.  Long: Follow program prescription in THR.  Short - review home exercise Long - Increase overall MET level  Short: walk at home an 1-2 days extra a week. Long: increase home exercise routine independently  Short - Tyress will attend regularly  Long - Jyaire will continue to increase MET level       Discharge Exercise Prescription (Final Exercise Prescription Changes): Exercise Prescription Changes - 11/25/17 1600      Home Exercise Plan   Plans to continue exercise at  Home (comment) walking at home    Frequency  Add 2 additional days to program exercise sessions.    Initial Home Exercises Provided  11/20/17       Nutrition:  Target Goals: Understanding of nutrition guidelines, daily intake of sodium <154m, cholesterol <2075m calories 30% from fat and 7% or less from saturated fats, daily to have 5 or more servings of fruits and vegetables.  Biometrics: Pre Biometrics - 10/30/17 1353      Pre Biometrics   Height  5' 6.5" (1.689 m)    Weight  232 lb 1.6 oz (105.3 kg)    Waist Circumference  43 inches    Hip Circumference  47 inches    Waist to Hip Ratio  0.91 %    BMI (Calculated)  36.91    Single Leg Stand  4.5 seconds        Nutrition Therapy Plan and Nutrition Goals: Nutrition Therapy & Goals - 11/27/17 1732       Nutrition Therapy   Diet  DM, TLC    Drug/Food Interactions  Purine/Gout    Protein (specify units)  12oz    Fiber  30 grams    Whole Grain Foods  3 servings    Saturated Fats  14 max. grams    Fruits and Vegetables  6 servings/day 8 ideal    Sodium  2000 grams      Personal Nutrition Goals   Nutrition Goal  Eat on a regular and consistent schedule throughout the day in order to prevent episodes of hyper and hypoglycemia and ultimately to have improved BG control over time    Personal Goal #2  Choose snacks that are a combination of a carbohydrate +  a protein, such as peanut butter crackers or fruit and nuts    Personal Goal #3  Use the plate method to help you plan meals at home and when eating out. Remember, peas corn and potatoes count as starches too    Comments  He has started to make changes to his diet but DM medication remains unchanges, and as a result, he has recently been experiencing episodes of hypoglycemia      Intervention Plan   Intervention  Prescribe, educate and counsel regarding individualized specific dietary modifications aiming towards targeted core components such as weight, hypertension, lipid management, diabetes, heart failure and other comorbidities.;Nutrition handout(s) given to patient. GOUT nutrition therapy and general meal planning for diabetes handouts provided along with email    Expected Outcomes  Short Term Goal: Understand basic principles of dietary content, such as calories, fat, sodium, cholesterol and nutrients.;Short Term Goal: A plan has been developed with personal nutrition goals set during dietitian appointment.;Long Term Goal: Adherence to prescribed nutrition plan.       Nutrition Assessments: Nutrition Assessments - 10/30/17 1330      MEDFICTS Scores   Pre Score  6       Nutrition Goals Re-Evaluation: Nutrition Goals Re-Evaluation    [place] Name 11/27/17 1743             Goals   Nutrition Goal  Eat on a regular and consistent  schedule throughout the day in order to prevent episodes of hyper and hypoglycemia and ultimately to have improved BG control over time       Comment  He has not been eating consistently, may not snack, or chooses candy as a snack rather than foods with protein + fiber       Expected Outcome  He will eat every 4-5 hours and choose foods that have complex carbohydrates, protein and fiber. Snacks should be a mix of 1 serving of protein and 1-2 servings of carbohydrate         Personal Goal #2 Re-Evaluation   Personal Goal #2  Choose snacks that are a combination of 1-2 CHO servings + 1 serving protein, such as peanut butter crackers or fruit and nuts         Personal Goal #3 Re-Evaluation   Personal Goal #3  Use the plate method to help you plan meals at home and when eating out. Remember, peas corn and potatoes count as starches too          Nutrition Goals Discharge (Final Nutrition Goals Re-Evaluation): Nutrition Goals Re-Evaluation - 11/27/17 1743      Goals   Nutrition Goal  Eat on a regular and consistent schedule throughout the day in order to prevent episodes of hyper and hypoglycemia and ultimately to have improved BG control over time    Comment  He has not been eating consistently, may not snack, or chooses candy as a snack rather than foods with protein + fiber    Expected Outcome  He will eat every 4-5 hours and choose foods that have complex carbohydrates, protein and fiber. Snacks should be a mix of 1 serving of protein and 1-2 servings of carbohydrate      Personal Goal #2 Re-Evaluation   Personal Goal #2  Choose snacks that are a combination of 1-2 CHO servings + 1 serving protein, such as peanut butter crackers or fruit and nuts      Personal Goal #3 Re-Evaluation   Personal Goal #3  Use the plate method  to help you plan meals at home and when eating out. Remember, peas corn and potatoes count as starches too       Psychosocial: Target Goals: Acknowledge presence or  absence of significant depression and/or stress, maximize coping skills, provide positive support system. Participant is able to verbalize types and ability to use techniques and skills needed for reducing stress and depression.   Initial Review & Psychosocial Screening: Initial Psych Review & Screening - 10/30/17 1334      Initial Review   Current issues with  Current Stress Concerns    Source of Stress Concerns  Unable to participate in former interests or hobbies;Unable to perform yard/household activities    Comments  Started back 1/2 days at work after surgery, about to start back full time. 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 He is still limited post surgery, but is anxious to get back to activities.      Family Dynamics   Good Support System?  Yes spouse      Barriers   Psychosocial barriers to participate in program  There are no identifiable barriers or psychosocial needs.;The patient should benefit from training in stress management and relaxation.      Screening Interventions   Interventions  Encouraged to exercise;Provide feedback about the scores to participant;Program counselor consult;To provide support and resources with identified psychosocial needs    Expected Outcomes  Short Term goal: Utilizing psychosocial counselor, staff and physician to assist with identification of specific Stressors or current issues interfering with healing process. Setting desired goal for each stressor or current issue identified.;Long Term Goal: Stressors or current issues are controlled or eliminated.;Short Term goal: Identification and review with participant of any Quality of Life or Depression concerns found by scoring the questionnaire.;Long Term goal: The participant improves quality of Life and PHQ9 Scores as seen by post scores and/or verbalization of changes       Quality of Life Scores:   Quality of Life - 10/30/17 1340      Quality of Life Scores   Health/Function Pre  26.4 %    Socioeconomic Pre  29.25 %    Psych/Spiritual Pre  29.14 %    Family Pre  30 %    GLOBAL Pre  28.06 %      Scores of 19 and below usually indicate a poorer quality of life in these areas.  A difference of  2-3 points is a clinically meaningful difference.  A difference of 2-3 points in the total score of the Quality of Life Index has been associated with significant improvement in overall quality of life, self-image, physical symptoms, and general health in studies assessing change in quality of life.  PHQ-9: Recent Review Flowsheet Data    Depression screen [facility] 2/9 10/30/2017   Decreased Interest 0   Down, Depressed, Hopeless 0   PHQ - 2 Score 0   Altered sleeping 0   Tired, decreased energy 0   Change in appetite 0   Feeling bad or failure about yourself  0   Trouble concentrating 0   Moving slowly or fidgety/restless 0   Suicidal thoughts 0   PHQ-9 Score 0     Interpretation of Total Score  Total Score Depression Severity:  1-4 = Minimal depression, 5-9 = Mild depression, 10-14 = Moderate depression, 15-19 = Moderately severe depression, 20-27 = Severe depression   Psychosocial  Evaluation and Intervention: Psychosocial Evaluation - 11/10/17 1717      Psychosocial Evaluation & Interventions   Interventions  Encouraged to exercise with the program and follow exercise prescription    Comments  Counselor met with Mr. Perrow [facility]) today for initial psychosocial evaluation.  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  He has a strong support system with a spouse of 11 years and his parents who live locally.  Severiano also is actively involved in his local church.  He reports sleeping well and having a good appetite.  Lonzell denies a history of depression or anxiety or any current symptoms and states he is generally in a positive mood most of the time.  Javad states he has minimal stress in  his life - although he started back to work full-time today he does not anticipate this to be problematic.  Juvencio has goals to improve his health overall - to increase his stamina and strength.  Staff will follow with him.      Expected Outcomes  Short:  Ranbir will continue to come consistently although he is back at work full time.     Long:  Ehan will begin to eat healthier and to exercise consistently to be healthier overall.      Continue Psychosocial Services   Follow up required by staff       Psychosocial Re-Evaluation: Psychosocial Re-Evaluation    [place] Name 11/21/17 1129             Psychosocial Re-Evaluation   Current issues with  Current Stress Concerns       Comments  Jarrette reports no new stress concerns       Expected Outcomes  Short - Pt will attend HT for exercise and education on stress Long - Pt will kepp stress level low       Interventions  Encouraged to attend Cardiac Rehabilitation for the exercise       Continue Psychosocial Services   Follow up required by staff          Psychosocial Discharge (Final Psychosocial Re-Evaluation): Psychosocial Re-Evaluation - 11/21/17 1129      Psychosocial Re-Evaluation   Current issues with  Current Stress Concerns    Comments  Zigmund reports no new stress concerns    Expected Outcomes  Short - Pt will attend HT for exercise and education on stress Long - Pt will kepp stress level low    Interventions  Encouraged to attend Cardiac Rehabilitation for the exercise    Continue Psychosocial Services   Follow up required by staff       Vocational Rehabilitation: Provide vocational rehab assistance to qualifying candidates.   Vocational Rehab Evaluation & Intervention: Vocational Rehab - 10/30/17 1347      Initial Vocational Rehab Evaluation & Intervention   Assessment shows need for Vocational Rehabilitation  No       Education: Education Goals: Education classes will be provided on a variety of topics geared toward  better understanding of heart health and risk factor modification. Participant will state understanding/return demonstration of topics presented as noted by education test scores.  Learning Barriers/Preferences: Learning Barriers/Preferences - 10/30/17 1346      Learning Barriers/Preferences   Learning Barriers  None    Learning Preferences  Verbal Instruction;Individual Instruction       Education Topics:  AED/CPR: - Group verbal and written instruction with the use of models to demonstrate the basic use of the AED with  the basic ABC's of resuscitation.   General Nutrition Guidelines/Fats and Fiber: -Group instruction provided by verbal, written material, models and posters to present the general guidelines for heart healthy nutrition. Gives an explanation and review of dietary fats and fiber.   Controlling Sodium/Reading Food Labels: -Group verbal and written material supporting the discussion of sodium use in heart healthy nutrition. Review and explanation with models, verbal and written materials for utilization of the food label.   Exercise Physiology & General Exercise Guidelines: - Group verbal and written instruction with models to review the exercise physiology of the cardiovascular system and associated critical values. Provides general exercise guidelines with specific guidelines to those with heart or lung disease.    Aerobic Exercise & Resistance Training: - Gives group verbal and written instruction on the various components of exercise. Focuses on aerobic and resistive training programs and the benefits of this training and how to safely progress through these programs..   Flexibility, Balance, Mind/Body Relaxation: Provides group verbal/written instruction on the benefits of flexibility and balance training, including mind/body exercise modes such as yoga, pilates and tai chi.  Demonstration and skill practice provided.   Stress and Anxiety: - Provides group  verbal and written instruction about the health risks of elevated stress and causes of high stress.  Discuss the correlation between heart/lung disease and anxiety and treatment options. Review healthy ways to manage with stress and anxiety.   Cardiac Rehab from 11/26/2017 in [facility] Cardiac and Pulmonary Rehab  Date  11/12/17  Educator  [facility]  Instruction Review Code  1- Verbalizes Understanding      Depression: - Provides group verbal and written instruction on the correlation between heart/lung disease and depressed mood, treatment options, and the stigmas associated with seeking treatment.   Anatomy & Physiology of the Heart: - Group verbal and written instruction and models provide basic cardiac anatomy and physiology, with the coronary electrical and arterial systems. Review of Valvular disease and Heart Failure   Cardiac Rehab from 11/26/2017 in [facility] Cardiac and Pulmonary Rehab  Date  11/10/17  Educator  CE  Instruction Review Code  1- Verbalizes Understanding      Cardiac Procedures: - Group verbal and written instruction to review commonly prescribed medications for heart disease. Reviews the medication, class of the drug, and side effects. Includes the steps to properly store meds and maintain the prescription regimen. (beta blockers and nitrates)   Cardiac Rehab from 11/26/2017 in [facility] Cardiac and Pulmonary Rehab  Date  11/24/17  Educator  CE  Instruction Review Code  1- Verbalizes Understanding      Cardiac Medications I: - Group verbal and written instruction to review commonly prescribed medications for heart disease. Reviews the medication, class of the drug, and side effects. Includes the steps to properly store meds and maintain the prescription regimen.   Cardiac Rehab from 11/26/2017 in [facility] Cardiac and Pulmonary Rehab  Date  11/17/17  Educator  CE  Instruction Review Code  1- Verbalizes Understanding      Cardiac Medications II: -Group verbal and written instruction  to review commonly prescribed medications for heart disease. Reviews the medication, class of the drug, and side effects. (all other drug classes)    Go Sex-Intimacy & Heart Disease, Get SMART - Goal Setting: - Group verbal and written instruction through game format to discuss heart disease and the return to sexual intimacy. Provides group verbal and written material to discuss and apply goal setting through the application of the S.M.A.R.T. Method.  Cardiac Rehab from 11/26/2017 in [facility] Cardiac and Pulmonary Rehab  Date  11/24/17  Educator  CE  Instruction Review Code  1- Verbalizes Understanding      Other Matters of the Heart: - Provides group verbal, written materials and models to describe Stable Angina and Peripheral Artery. Includes description of the disease process and treatment options available to the cardiac patient.   Cardiac Rehab from 11/26/2017 in [facility] Cardiac and Pulmonary Rehab  Date  11/10/17  Educator  CE  Instruction Review Code  1- Verbalizes Understanding      Exercise & Equipment Safety: - Individual verbal instruction and demonstration of equipment use and safety with use of the equipment.   Cardiac Rehab from 11/26/2017 in [facility] Cardiac and Pulmonary Rehab  Date  10/30/17  Educator  [facility]  Instruction Review Code  1- Verbalizes Understanding      Infection Prevention: - Provides verbal and written material to individual with discussion of infection control including proper hand washing and proper equipment cleaning during exercise session.   Cardiac Rehab from 11/26/2017 in [facility] Cardiac and Pulmonary Rehab  Date  10/30/17  Educator  [facility]  Instruction Review Code  1- Verbalizes Understanding      Falls Prevention: - Provides verbal and written material to individual with discussion of falls prevention and safety.   Cardiac Rehab from 11/26/2017 in [facility] Cardiac and Pulmonary Rehab  Date  10/30/17  Educator  [facility]  Instruction Review Code  1- Verbalizes  Understanding      Diabetes: - Individual verbal and written instruction to review signs/symptoms of diabetes, desired ranges of glucose level fasting, after meals and with exercise. Acknowledge that pre and post exercise glucose checks will be done for 3 sessions at entry of program.   Cardiac Rehab from 11/26/2017 in [facility] Cardiac and Pulmonary Rehab  Date  10/30/17  Educator  [facility]  Instruction Review Code  1- Verbalizes Understanding      Know Your Numbers and Risk Factors: -Group verbal and written instruction about important numbers in your health.  Discussion of what are risk factors and how they play a role in the disease process.  Review of Cholesterol, Blood Pressure, Diabetes, and BMI and the role they play in your overall health.   Sleep Hygiene: -Provides group verbal and written instruction about how sleep can affect your health.  Define sleep hygiene, discuss sleep cycles and impact of sleep habits. Review good sleep hygiene tips.    Cardiac Rehab from 11/26/2017 in [facility] Cardiac and Pulmonary Rehab  Date  11/26/17  Educator  [facility]  Instruction Review Code  1- Verbalizes Understanding      Other: -Provides group and verbal instruction on various topics (see comments)   Knowledge Questionnaire Score: Knowledge Questionnaire Score - 10/30/17 1347      Knowledge Questionnaire Score   Pre Score  25/28 correct answers reviewed with [place]       Core Components/Risk Factors/Patient Goals at Admission: Personal Goals and Risk Factors at Admission - 10/30/17 1322      Core Components/Risk Factors/Patient Goals on Admission    Weight Management  Yes;Weight Loss    Intervention  Weight Management: Develop a combined nutrition and exercise program designed to reach desired caloric intake, while maintaining appropriate intake of nutrient and fiber, sodium and fats, and appropriate energy expenditure required for the weight goal.;Weight Management/Obesity: Establish reasonable short  term and long term weight goals.    Admit Weight  222 lb (100.7 kg)  Goal Weight: Short Term  217 lb (98.4 kg)    Goal Weight: Long Term  200 lb (90.7 kg)    Expected Outcomes  Short Term: Continue to assess and modify interventions until short term weight is achieved;Long Term: Adherence to nutrition and physical activity/exercise program aimed toward attainment of established weight goal;Weight Loss: Understanding of general recommendations for a balanced deficit meal plan, which promotes 1-2 lb weight loss per week and includes a negative energy balance of [phone] kcal/d;Understanding recommendations for meals to include 15-35% energy as protein, 25-35% energy from fat, 35-60% energy from carbohydrates, less than 221m of dietary cholesterol, 20-35 gm of total fiber daily;Understanding of distribution of calorie intake throughout the day with the consumption of 4-5 meals/snacks    Diabetes  Yes    Intervention  Provide education about signs/symptoms and action to take for hypo/hyperglycemia.;Provide education about proper nutrition, including hydration, and aerobic/resistive exercise prescription along with prescribed medications to achieve blood glucose in normal ranges: Fasting glucose 65-99 mg/dL    Expected Outcomes  Short Term: Participant verbalizes understanding of the signs/symptoms and immediate care of hyper/hypoglycemia, proper foot care and importance of medication, aerobic/resistive exercise and nutrition plan for blood glucose control.;Long Term: Attainment of HbA1C < 7%.    Hypertension  Yes    Intervention  Provide education on lifestyle modifcations including regular physical activity/exercise, weight management, moderate sodium restriction and increased consumption of fresh fruit, vegetables, and low fat dairy, alcohol moderation, and smoking cessation.;Monitor prescription use compliance.    Expected Outcomes  Short Term: Continued assessment and intervention until BP is < 140/94m HG in hypertensive participants. < 130/809mG in hypertensive participants with diabetes, heart failure or chronic kidney disease.;Long Term: Maintenance of blood pressure at goal levels.    Lipids  Yes    Intervention  Provide education and support for participant on nutrition & aerobic/resistive exercise along with prescribed medications to achieve LDL <32m37mDL >40mg41m Expected Outcomes  Short Term: Participant states understanding of desired cholesterol values and is compliant with medications prescribed. Participant is following exercise prescription and nutrition guidelines.;Long Term: Cholesterol controlled with medications as prescribed, with individualized exercise RX and with personalized nutrition plan. Value goals: LDL < 32mg,29m > 40 mg.       Core Components/Risk Factors/Patient Goals Review:  Goals and Risk Factor Review    Row Name 11/21/17 1125             Core Components/Risk Factors/Patient Goals Review   Personal Goals Review  Weight Management/Obesity;Lipids;Diabetes;Hypertension;Stress       Review  Pt i smonitoring BG 4 times per day - ave FBG is 80-110.  BP has been good in HT - not monitoring at home but taking all meds as directed.  He has lost 20 lb by eating chicken and veggies and avoiding red meat, bread and potatoes.  Pt states he has little stress now that he is managing heart health.  he says "I feel better when I leave exercise than when I started'.  [facility].  He stil has chest numbness that his Dr said may take a year to go away.       Expected Outcomes  Short - Staff will schedule Kevan with RD to fine tune diet, he will continue exercise  Long - Pt will maintain healthy eating and exercise habits          Core Components/Risk Factors/Patient Goals at Discharge (  Final Review):  Goals and Risk Factor Review - 11/21/17 1125      Core Components/Risk Factors/Patient Goals Review   Personal Goals Review  Weight  Management/Obesity;Lipids;Diabetes;Hypertension;Stress    Review  Pt i smonitoring BG 4 times per day - ave FBG is 80-110.  BP has been good in HT - not monitoring at home but taking all meds as directed.  He has lost 20 lb by eating chicken and veggies and avoiding red meat, bread and potatoes.  Pt states he has little stress now that he is managing heart health.  he says "I feel better when I leave exercise than when I started'.  He sees a gout specialist at [facility] in May.  He stil has chest numbness that his Dr said may take a year to go away.    Expected Outcomes  Short - Staff will schedule Alondra with RD to fine tune diet, he will continue exercise  Long - Pt will maintain healthy eating and exercise habits       ITP Comments: ITP Comments    Row Name 10/30/17 1308 11/05/17 0638 11/27/17 1643 12/03/17 0634     ITP Comments  Med Review completed. Initial ITP created. Diagnosis can be found in Care Everywhere 10/06/17  30 day review comleted. Continue with ITP unless directed changes per Medical Director   New to program  Blood sugar low times 2 visits.  Note sent  to  Dvid's MD today  30 day review. Continue with ITP unless directed changes per Medical Director       Comments:

## 2017-12-04 DIAGNOSIS — Z48812 Encounter for surgical aftercare following surgery on the circulatory system: Secondary | ICD-10-CM | POA: Diagnosis not present

## 2017-12-04 DIAGNOSIS — Z951 Presence of aortocoronary bypass graft: Secondary | ICD-10-CM

## 2017-12-04 NOTE — Progress Notes (Signed)
Daily Session Note  Patient Details  Name: Randy Luna MRN: [id number] Date of Birth: 03/31/1961 Referring Provider:     Cardiac Rehab from 10/30/2017 in [facility] Cardiac and Pulmonary Rehab  Referring Provider  Sabra Heck      Encounter Date: 12/04/2017  Check In: Session Check In - 12/04/17 1650      Check-In   Location  [facility]-Cardiac & Pulmonary Rehab    Staff Present  Heath Lark, RN, BSN, Laveda Norman, BS, ACSM CEP, Exercise Physiologist;Taraann Olthoff Oletta Darter, [place], ACSM CEP, Exercise Physiologist    Supervising physician immediately available to respond to emergencies  See telemetry face sheet for immediately available ER MD    Medication changes reported      No    Fall or balance concerns reported     No    Warm-up and Cool-down  Performed on first and last piece of equipment    Resistance Training Performed  Yes    VAD Patient?  No      Pain Assessment   Currently in Pain?  No/denies    Multiple Pain Sites  No          Social History   Tobacco Use  Smoking Status Never Smoker  Smokeless Tobacco Never Used    Goals Met:  Independence with exercise equipment Exercise tolerated well No report of cardiac concerns or symptoms Strength training completed today  Goals Unmet:  Not Applicable  Comments: Pt able to follow exercise prescription today without complaint.  Will continue to monitor for progression.    Dr. Emily Filbert is Medical Director for [place] and [facility].

## 2017-12-10 ENCOUNTER — Encounter: Payer: Managed Care, Other (non HMO) | Admitting: *Deleted

## 2017-12-10 DIAGNOSIS — Z951 Presence of aortocoronary bypass graft: Secondary | ICD-10-CM

## 2017-12-10 DIAGNOSIS — Z48812 Encounter for surgical aftercare following surgery on the circulatory system: Secondary | ICD-10-CM | POA: Diagnosis not present

## 2017-12-10 LAB — GLUCOSE, CAPILLARY
GLUCOSE-CAPILLARY: 60 mg/dL — AB (ref 65–99)
Glucose-Capillary: 79 mg/dL (ref 65–99)

## 2017-12-10 NOTE — Progress Notes (Signed)
Daily Session Note  Patient Details  Name: Randy Luna MRN: [id number] Date of Birth: August 22, 1960 Referring Provider:     Cardiac Rehab from 10/30/2017 in [facility] Cardiac and Pulmonary Rehab  Referring Provider  Sabra Heck      Encounter Date: 12/10/2017  Check In: Session Check In - 12/10/17 1622      Check-In   Location  [facility]-Cardiac & Pulmonary Rehab    Staff Present  Constance Goltz, RN BSN;Jessica Luan Pulling, MA, RCEP, CCRP, Exercise Physiologist;Meredith Sherryll Burger, RN BSN    Supervising physician immediately available to respond to emergencies  See telemetry face sheet for immediately available ER MD    Medication changes reported      No    Fall or balance concerns reported     No    Tobacco Cessation  No Change    Warm-up and Cool-down  Performed on first and last piece of equipment    Resistance Training Performed  Yes    VAD Patient?  No      Pain Assessment   Currently in Pain?  No/denies          Social History   Tobacco Use  Smoking Status Never Smoker  Smokeless Tobacco Never Used    Goals Met:  Independence with exercise equipment Exercise tolerated well No report of cardiac concerns or symptoms Strength training completed today  Goals Unmet:  Not Applicable  Comments: Pt able to follow exercise prescription today without complaint.  Will continue to monitor for progression.    Dr. Emily Filbert is Medical Director for [place] and [facility].

## 2017-12-11 ENCOUNTER — Encounter: Payer: Managed Care, Other (non HMO) | Admitting: *Deleted

## 2017-12-11 DIAGNOSIS — Z48812 Encounter for surgical aftercare following surgery on the circulatory system: Secondary | ICD-10-CM | POA: Diagnosis not present

## 2017-12-11 DIAGNOSIS — Z951 Presence of aortocoronary bypass graft: Secondary | ICD-10-CM

## 2017-12-11 LAB — GLUCOSE, CAPILLARY
GLUCOSE-CAPILLARY: 107 mg/dL — AB (ref 65–99)
GLUCOSE-CAPILLARY: 76 mg/dL (ref 65–99)

## 2017-12-11 NOTE — Progress Notes (Signed)
Daily Session Note  Patient Details  Name: Randy Luna MRN: [id number] Date of Birth: 08-Jun-1961 Referring Provider:     Cardiac Rehab from 10/30/2017 in [facility] Cardiac and Pulmonary Rehab  Referring Provider  Sabra Heck      Encounter Date: 12/11/2017  Check In: Session Check In - 12/11/17 1649      Check-In   Location  [facility]-Cardiac & Pulmonary Rehab    Staff Present  Carson Myrtle, BS, RRT, Respiratory Therapist;Nana Addai, RN BSN;Dyshaun Bonzo Sherryll Burger, RN BSN    Supervising physician immediately available to respond to emergencies  See telemetry face sheet for immediately available ER MD    Medication changes reported      No    Fall or balance concerns reported     No    Tobacco Cessation  No Change    Warm-up and Cool-down  Performed on first and last piece of equipment    Resistance Training Performed  Yes    VAD Patient?  No      Pain Assessment   Currently in Pain?  No/denies          Social History   Tobacco Use  Smoking Status Never Smoker  Smokeless Tobacco Never Used    Goals Met:  Independence with exercise equipment Exercise tolerated well No report of cardiac concerns or symptoms Strength training completed today  Goals Unmet:  Not Applicable  Comments: Pt able to follow exercise prescription today without complaint.  Will continue to monitor for progression.    Dr. Emily Filbert is Medical Director for [place] and [facility].

## 2017-12-15 ENCOUNTER — Encounter: Payer: Managed Care, Other (non HMO) | Attending: Internal Medicine | Admitting: *Deleted

## 2017-12-15 DIAGNOSIS — Z951 Presence of aortocoronary bypass graft: Secondary | ICD-10-CM | POA: Diagnosis not present

## 2017-12-15 DIAGNOSIS — E119 Type 2 diabetes mellitus without complications: Secondary | ICD-10-CM | POA: Diagnosis not present

## 2017-12-15 DIAGNOSIS — Z79899 Other long term (current) drug therapy: Secondary | ICD-10-CM | POA: Insufficient documentation

## 2017-12-15 DIAGNOSIS — Z7984 Long term (current) use of oral hypoglycemic drugs: Secondary | ICD-10-CM | POA: Diagnosis not present

## 2017-12-15 DIAGNOSIS — Z48812 Encounter for surgical aftercare following surgery on the circulatory system: Secondary | ICD-10-CM | POA: Diagnosis present

## 2017-12-15 DIAGNOSIS — I10 Essential (primary) hypertension: Secondary | ICD-10-CM | POA: Insufficient documentation

## 2017-12-15 NOTE — Progress Notes (Signed)
Daily Session Note  Patient Details  Name: Randy Luna MRN: [id number] Date of Birth: 1961-07-04 Referring Provider:     Cardiac Rehab from 10/30/2017 in [facility] Cardiac and Pulmonary Rehab  Referring Provider  Sabra Heck      Encounter Date: 12/15/2017  Check In: Session Check In - 12/15/17 1633      Check-In   Location  [facility]-Cardiac & Pulmonary Rehab    Staff Present  Earlean Shawl, BS, ACSM CEP, Exercise Physiologist;Nana Addai, RN BSN;Carroll Enterkin, RN, BSN    Supervising physician immediately available to respond to emergencies  See telemetry face sheet for immediately available ER MD    Medication changes reported      No    Fall or balance concerns reported     No    Tobacco Cessation  No Change    Warm-up and Cool-down  Performed on first and last piece of equipment    Resistance Training Performed  Yes    VAD Patient?  No      Pain Assessment   Currently in Pain?  No/denies    Multiple Pain Sites  No          Social History   Tobacco Use  Smoking Status Never Smoker  Smokeless Tobacco Never Used    Goals Met:  Independence with exercise equipment Exercise tolerated well No report of cardiac concerns or symptoms Strength training completed today  Goals Unmet:  Not Applicable  Comments: Pt able to follow exercise prescription today without complaint.  Will continue to monitor for progression.    Dr. Emily Filbert is Medical Director for [place] and [facility].

## 2017-12-17 DIAGNOSIS — Z951 Presence of aortocoronary bypass graft: Secondary | ICD-10-CM

## 2017-12-17 DIAGNOSIS — Z48812 Encounter for surgical aftercare following surgery on the circulatory system: Secondary | ICD-10-CM | POA: Diagnosis not present

## 2017-12-17 NOTE — Progress Notes (Signed)
Daily Session Note  Patient Details  Name: Randy Luna MRN: [id number] Date of Birth: [birth year] Referring Provider:     Cardiac Rehab from 10/30/2017 in [facility] Cardiac and Pulmonary Rehab  Referring Provider  Sabra Heck      Encounter Date: 12/17/2017  Check In: Session Check In - 12/17/17 1723      Check-In   Location  [facility]-Cardiac & Pulmonary Rehab    Staff Present  Justin Mend RCP,RRT,BSRT;Heath Lark, RN, BSN, Lance Sell, BA, ACSM CEP, Exercise Physiologist    Supervising physician immediately available to respond to emergencies  See telemetry face sheet for immediately available ER MD    Medication changes reported      No    Fall or balance concerns reported     No    Tobacco Cessation  No Change    Warm-up and Cool-down  Performed on first and last piece of equipment    Resistance Training Performed  Yes    VAD Patient?  No      Pain Assessment   Currently in Pain?  No/denies          Social History   Tobacco Use  Smoking Status Never Smoker  Smokeless Tobacco Never Used    Goals Met:  Independence with exercise equipment Exercise tolerated well No report of cardiac concerns or symptoms Strength training completed today  Goals Unmet:  Not Applicable  Comments: Pt able to follow exercise prescription today without complaint.  Will continue to monitor for progression.   Dr. Emily Filbert is Medical Director for [place] and [facility].

## 2017-12-18 DIAGNOSIS — Z48812 Encounter for surgical aftercare following surgery on the circulatory system: Secondary | ICD-10-CM | POA: Diagnosis not present

## 2017-12-18 DIAGNOSIS — Z951 Presence of aortocoronary bypass graft: Secondary | ICD-10-CM

## 2017-12-18 NOTE — Progress Notes (Signed)
Daily Session Note  Patient Details  Name: Randy Luna MRN: [id number] Date of Birth: 1961-03-17 Referring Provider:     Cardiac Rehab from 10/30/2017 in [facility] Cardiac and Pulmonary Rehab  Referring Provider  Sabra Heck      Encounter Date: 12/18/2017  Check In: Session Check In - 12/18/17 1648      Check-In   Location  [facility]-Cardiac & Pulmonary Rehab    Staff Present  Justin Mend RCP,RRT,BSRT;Heath Lark, RN, BSN, Laveda Norman, BS, ACSM CEP, Exercise Physiologist    Supervising physician immediately available to respond to emergencies  See telemetry face sheet for immediately available ER MD    Medication changes reported      No    Fall or balance concerns reported     No    Tobacco Cessation  No Change    Warm-up and Cool-down  Performed on first and last piece of equipment    Resistance Training Performed  Yes    VAD Patient?  No      Pain Assessment   Currently in Pain?  No/denies          Social History   Tobacco Use  Smoking Status Never Smoker  Smokeless Tobacco Never Used    Goals Met:  Independence with exercise equipment Exercise tolerated well No report of cardiac concerns or symptoms Strength training completed today  Goals Unmet:  Not Applicable  Comments: Pt able to follow exercise prescription today without complaint.  Will continue to monitor for progression.   Dr. Emily Filbert is Medical Director for [place] and [facility].

## 2017-12-24 ENCOUNTER — Encounter: Payer: Managed Care, Other (non HMO) | Admitting: *Deleted

## 2017-12-24 DIAGNOSIS — Z48812 Encounter for surgical aftercare following surgery on the circulatory system: Secondary | ICD-10-CM | POA: Diagnosis not present

## 2017-12-24 DIAGNOSIS — Z951 Presence of aortocoronary bypass graft: Secondary | ICD-10-CM

## 2017-12-24 NOTE — Progress Notes (Signed)
Daily Session Note  Patient Details  Name: BILLYJOE GO MRN: [id number] Date of Birth: 03-03-1961 Referring Provider:     Cardiac Rehab from 10/30/2017 in [facility] Cardiac and Pulmonary Rehab  Referring Provider  Sabra Heck      Encounter Date: 12/24/2017  Check In: Session Check In - 12/24/17 1638      Check-In   Location  [facility]-Cardiac & Pulmonary Rehab    Staff Present  Gerlene Burdock, RN, BSN;Ciaran Begay Sherryll Burger, RN Vickki Hearing, BA, ACSM CEP, Exercise Physiologist    Supervising physician immediately available to respond to emergencies  See telemetry face sheet for immediately available ER MD    Medication changes reported      No    Fall or balance concerns reported     No    Tobacco Cessation  No Change    Warm-up and Cool-down  Performed on first and last piece of equipment    Resistance Training Performed  Yes    VAD Patient?  No      Pain Assessment   Currently in Pain?  No/denies          Social History   Tobacco Use  Smoking Status Never Smoker  Smokeless Tobacco Never Used    Goals Met:  Independence with exercise equipment Exercise tolerated well No report of cardiac concerns or symptoms Strength training completed today  Goals Unmet:  Not Applicable  Comments: Pt able to follow exercise prescription today without complaint.  Will continue to monitor for progression.    Dr. Emily Filbert is Medical Director for [place] and [facility].

## 2017-12-25 DIAGNOSIS — Z48812 Encounter for surgical aftercare following surgery on the circulatory system: Secondary | ICD-10-CM | POA: Diagnosis not present

## 2017-12-25 DIAGNOSIS — Z951 Presence of aortocoronary bypass graft: Secondary | ICD-10-CM

## 2017-12-25 NOTE — Progress Notes (Signed)
Daily Session Note  Patient Details  Name: Randy Luna MRN: [id number] Date of Birth: 07-Apr-1961 Referring Provider:     Cardiac Rehab from 10/30/2017 in [facility]  Referring Provider  Sabra Heck      Encounter Date: 12/25/2017  Check In: Session Check In - 12/25/17 1640      Check-In   Location  [facility]-Cardiac & Pulmonary Rehab    Staff Present  Justin Mend Jaci Carrel, BS, ACSM CEP, Exercise Physiologist;Meredith Sherryll Burger, RN BSN    Supervising physician immediately available to respond to emergencies  See telemetry face sheet for immediately available ER MD    Medication changes reported      No    Fall or balance concerns reported     No    Tobacco Cessation  No Change    Warm-up and Cool-down  Performed on first and last piece of equipment    Resistance Training Performed  Yes    VAD Patient?  No      Pain Assessment   Currently in Pain?  No/denies          Social History   Tobacco Use  Smoking Status Never Smoker  Smokeless Tobacco Never Used    Goals Met:  Independence with exercise equipment Exercise tolerated well No report of cardiac concerns or symptoms Strength training completed today  Goals Unmet:  Not Applicable  Comments: Pt able to follow exercise prescription today without complaint.  Will continue to monitor for progression.   Dr. Emily Filbert is Medical Director for [facility] and [facility].

## 2017-12-29 ENCOUNTER — Encounter: Payer: Managed Care, Other (non HMO) | Admitting: *Deleted

## 2017-12-29 DIAGNOSIS — Z48812 Encounter for surgical aftercare following surgery on the circulatory system: Secondary | ICD-10-CM | POA: Diagnosis not present

## 2017-12-29 DIAGNOSIS — Z951 Presence of aortocoronary bypass graft: Secondary | ICD-10-CM

## 2017-12-29 NOTE — Progress Notes (Signed)
Daily Session Note  Patient Details  Name: Randy Luna MRN: [id number] Date of Birth: [birth year] Referring Provider:     Cardiac Rehab from 10/30/2017 in [facility] Card[facility] Rehab  Referring Provider  Sabra Heck      Encounter Date: 12/29/2017  Check In: Session Check In - 12/29/17 1740      Check-In   Location  [facility]    Staff Present  Gerlene Burdock, RN, Moises Blood, BS, ACSM CEP, Exercise Physiologist;Amanda Oletta Darter, [place], ACSM CEP, Exercise Physiologist    Supervising physician immediately available to respond to emergencies  See telemetry face sheet for immediately available ER MD    Medication changes reported      No    Fall or balance concerns reported     No    Tobacco Cessation  No Change    Warm-up and Cool-down  Performed on first and last piece of equipment    Resistance Training Performed  Yes    VAD Patient?  No      Pain Assessment   Currently in Pain?  No/denies    Multiple Pain Sites  No          Social History   Tobacco Use  Smoking Status Never Smoker  Smokeless Tobacco Never Used    Goals Met:  Independence with exercise equipment Exercise tolerated well No report of cardiac concerns or symptoms Strength training completed today  Goals Unmet:  Not Applicable  Comments: Pt able to follow exercise prescription today without complaint.  Will continue to monitor for progression.    Dr. Emily Filbert is Medical Director for [facility].

## 2017-12-31 ENCOUNTER — Encounter: Payer: Managed Care, Other (non HMO) | Admitting: *Deleted

## 2017-12-31 ENCOUNTER — Encounter: Payer: Self-pay | Admitting: *Deleted

## 2017-12-31 DIAGNOSIS — Z951 Presence of aortocoronary bypass graft: Secondary | ICD-10-CM

## 2017-12-31 DIAGNOSIS — Z48812 Encounter for surgical aftercare following surgery on the circulatory system: Secondary | ICD-10-CM | POA: Diagnosis not present

## 2017-12-31 NOTE — Progress Notes (Signed)
Daily Session Note  Patient Details  Name: Randy Luna MRN: [id number] Date of Birth: 10/03/1960 Referring Provider:     Cardiac Rehab from 10/30/2017 in [facility] Cardiac and [facility] Re[facility]  Referring Provider  Sabra Heck      Encounter Date: 12/31/2017  Check In: Session Check In - 12/31/17 1657      Check-In   Location  [facility]Card[facility]    Staff Present  Gerlene Burdock, RN, BSN;Ramiel Forti Sherryll Burger, RN Vickki Hearing, BA, ACSM CEP, Exercise Physiologist    Supervising physician immediately available to respond to emergencies  See telemetry face sheet for immediately available ER MD    Medication changes reported      No    Fall or balance concerns reported     No    Tobacco Cessation  No Change    Warm-up and Cool-down  Performed on first and last piece of equipment    Resistance Training Performed  Yes    VAD Patient?  No      Pain Assessment   Currently in Pain?  No/denies          Social History   Tobacco Use  Smoking Status Never Smoker  Smokeless Tobacco Never Used    Goals Met:  Independence with exercise equipment Exercise tolerated well No report of cardiac concerns or symptoms Strength training completed today  Goals Unmet:  Not Applicable  Comments: Pt able to follow exercise prescription today without complaint.  Will continue to monitor for progression.    Dr. Emily Filbert is Medical Director for [facility] and [facility].

## 2017-12-31 NOTE — Progress Notes (Signed)
Cardiac Individual Treatment Plan  Patient Details  Name: Randy Luna MRN: [id number] Date of Birth: 11/08/1960 Referring Provider:     Cardiac Rehab from 10/30/2017 in [facility] Cardiac [facility] Pulmonary Rehab  Referring Provider  Sabra Heck      Initial Encounter Date:    Cardiac Rehab from 10/30/2017 in [facility] Cardiac and Pulmonary Rehab  Date  10/30/17  Referring Provider  Sabra Heck      Visit Diagnosis: S/P CABG x 4  Patient's Home Medications on Admission:  Current Outpatient Medications:  .  allopurinol (ZYLOPRIM) 100 MG tablet, Take 1 tablet (100 mg total) by mouth daily. (Patient not taking: Reported on 10/30/2017), Disp: 30 tablet, Rfl: 0 .  allopurinol (ZYLOPRIM) 100 MG tablet, Take 100 mg by mouth 2 (two) times daily., Disp: , Rfl:  .  aspirin 325 MG tablet, Take 650 mg by mouth daily., Disp: , Rfl:  .  Cyanocobalamin (VITAMIN B12) 3000 MCG SUBL, Place 3,000 mcg under the tongue 2 (two) times a week., Disp: , Rfl:  .  cyclobenzaprine (FLEXERIL) 10 MG tablet, Take 1 tablet (10 mg total) by mouth 3 (three) times daily as needed., Disp: 15 tablet, Rfl: 0 .  HUMALOG KWIKPEN 100 UNIT/ML KiwkPen, Inject 12 Units into the skin 3 (three) times daily., Disp: , Rfl:  .  Iron-Vitamin C (VITRON-C) 65-125 MG TABS, Take 65 mg by mouth daily., Disp: , Rfl:  .  LANTUS SOLOSTAR 100 UNIT/ML Solostar Pen, Inject 24 Units into the skin at bedtime. , Disp: , Rfl:  .  metFORMIN (GLUCOPHAGE) 1000 MG tablet, Take 1,000 mg by mouth 2 (two) times daily with a meal. , Disp: , Rfl:  .  metoprolol tartrate (LOPRESSOR) 25 MG tablet, Take 25 mg by mouth daily., Disp: , Rfl:  .  nitroGLYCERIN (NITROGLYN) 2 % ointment, Apply 0.5 inches topically every 6 (six) hours. (Patient not taking: Reported on 10/30/2017), Disp: 30 g, Rfl: 0 .  ranitidine (ZANTAC) 150 MG tablet, Take 150 mg by mouth as needed., Disp: , Rfl:  .  rosuvastatin (CRESTOR) 20 MG tablet, Take 20 mg by mouth daily., Disp: , Rfl:  .  traMADol (ULTRAM) 50 MG  tablet, Take 1 tablet (50 mg total) by mouth every 12 (twelve) hours as needed., Disp: 12 tablet, Rfl: 0  Past Medical History: Past Medical History:  Diagnosis Date  . Diabetes mellitus without complication ([place])   . Hypertension     Tobacco Use: Social History   Tobacco Use  Smoking Status Never Smoker  Smokeless Tobacco Never Used    Labs: Recent Review Scientist, physiological    Labs for ITP Cardiac and Pulmonary Rehab Latest Ref Rng & Units 12/03/2016 09/21/2017 09/22/2017   Cholestrol 0 - 200 mg/dL - - 237(H)   LDLCALC 0 - 99 mg/dL - - 141(H)   HDL >40 mg/dL - - 36(L)   Trlycerides <150 mg/dL - - 301(H)   Hemoglobin A1c 4.8 - 5.6 % 11.3(H) 10.6(H) -       Exercise Target Goals:    Exercise Program Goal: Individual exercise prescription set using results from initial 6 min walk test and THRR while considering  patient's activity barriers and safety.   Exercise Prescription Goal: Initial exercise prescription builds to 30-45 minutes a day of aerobic activity, 2-3 days per week.  Home exercise guidelines will be given to patient during program as part of exercise prescription that the participant will acknowledge.  Activity Barriers & Risk Stratification: Activity Barriers & Cardiac Risk Stratification -  10/30/17 1348      Activity Barriers & Cardiac Risk Stratification   Activity Barriers  Joint Problems Leeon struggles with gout, that usually affects his feet and knees.    Cardiac Risk Stratification  Moderate       6 Minute Walk: 6 Minute Walk    Row Name 10/30/17 1355         6 Minute Walk   Distance  1186 feet     Walk Time  6 minutes     # of Rest Breaks  0     MPH  2.25     METS  3.43     RPE  12     Perceived Dyspnea   0     VO2 Peak  12.01     Symptoms  No     Resting HR  82 bpm     Resting BP  144/94     Resting Oxygen Saturation   99 %     Exercise Oxygen Saturation  during 6 min walk  100 %     Max Ex. HR  127 bpm     Max Ex. BP  158/84     2  Minute Post BP  142/86        Oxygen Initial Assessment:   Oxygen Re-Evaluation:   Oxygen Discharge (Final Oxygen Re-Evaluation):   Initial Exercise Prescription: Initial Exercise Prescription - 10/30/17 1300      Date of Initial Exercise RX and Referring Provider   Date  10/30/17    Referring Provider  Sabra Heck      Treadmill   MPH  2.5    Grade  1.5    Minutes  15    METs  3.43      Recumbant Bike   Level  6    RPM  60    Watts  40    Minutes  15    METs  3.4      T5 Nustep   Level  3    SPM  80    Minutes  15    METs  3.4      Prescription Details   Frequency (times per week)  3    Duration  Progress to 45 minutes of aerobic exercise without signs/symptoms of physical distress      Intensity   THRR 40-80% of Max Heartrate  114-148    Ratings of Perceived Exertion  11-13    Perceived Dyspnea  0-4      Resistance Training   Training Prescription  Yes    Weight  4 lb    Reps  10-15       Perform Capillary Blood Glucose checks as needed.  Exercise Prescription Changes: Exercise Prescription Changes    Row Name 10/30/17 1300 11/12/17 1600 11/20/17 1700 11/25/17 1500 11/25/17 1600     Response to Exercise   Blood Pressure (Admit)  144/94  138/70  -  148/80  -   Blood Pressure (Exercise)  158/84  152/78  -  162/84  -   Blood Pressure (Exit)  142/84  132/84  -  148/82  -   Heart Rate (Admit)  88 bpm  80 bpm  -  87 bpm  -   Heart Rate (Exercise)  127 bpm  123 bpm  -  118 bpm  -   Heart Rate (Exit)  89 bpm  103 bpm  -  95 bpm  -   Oxygen Saturation (Admit)  99 %  -  -  -  -   Oxygen Saturation (Exit)  100 %  -  -  -  -   Rating of Perceived Exertion (Exercise)  12  12  -  12  -   Symptoms  -  none  -  none  -   Duration  -  Continue with 45 min of aerobic exercise without signs/symptoms of physical distress.  -  Continue with 45 min of aerobic exercise without signs/symptoms of physical distress.  -   Intensity  -  THRR unchanged  -  THRR unchanged  -      Progression   Progression  -  Continue to progress workloads to maintain intensity without signs/symptoms of physical distress.  -  Continue to progress workloads to maintain intensity without signs/symptoms of physical distress.  -   Average METs  -  2.4  -  3.3  -     Resistance Training   Training Prescription  -  Yes  -  Yes  -   Weight  -  4 lb  -  4 lb  -   Reps  -  10-15  -  10-15  -     Treadmill   MPH  -  1.5  -  2  -   Grade  -  1.5  -  1.5  -   Minutes  -  15  -  15  -   METs  -  2.35  -  2.95  -     Recumbant Bike   Level  -  -  -  6  -   RPM  -  -  -  60  -   Watts  -  -  -  54  -   Minutes  -  -  -  15  -   METs  -  -  -  3.6  -     T5 Nustep   Level  -  3  -  -  -   SPM  -  80  -  -  -   Minutes  -  15  -  -  -   METs  -  2.4  -  -  -     Home Exercise Plan   Plans to continue exercise at  -  -  Home (comment) walking at home  -  Home (comment) walking at home   Frequency  -  -  Add 2 additional days to program exercise sessions.  -  Add 2 additional days to program exercise sessions.   Initial Home Exercises Provided  -  -  11/20/17  -  11/20/17   Row Name 12/12/17 1200 12/26/17 0900           Response to Exercise   Blood Pressure (Admit)  144/88  136/78      Blood Pressure (Exercise)  146/68  142/78      Blood Pressure (Exit)  130/64  134/72      Heart Rate (Admit)  107 bpm  96 bpm      Heart Rate (Exercise)  124 bpm  121 bpm      Heart Rate (Exit)  105 bpm  87 bpm      Rating of Perceived Exertion (Exercise)  12  12      Symptoms  none  none      Duration  Continue with 45  min of aerobic exercise without signs/symptoms of physical distress.  Continue with 45 min of aerobic exercise without signs/symptoms of physical distress.      Intensity  THRR unchanged  THRR unchanged        Progression   Progression  Continue to progress workloads to maintain intensity without signs/symptoms of physical distress.  Continue to progress workloads to  maintain intensity without signs/symptoms of physical distress.      Average METs  3  3        Resistance Training   Training Prescription  Yes  Yes      Weight  4 lb  4 lb      Reps  10-15  10-15        Interval Training   Interval Training  No  No        Treadmill   MPH  2  2      Grade  1.5  1.5      Minutes  15  15      METs  2.95  2.95        Recumbant Bike   Level  6  6      RPM  60  60      Watts  54  3215      Minutes  15  3      METs  3.6  -        T5 Nustep   Level  3  -      SPM  80  -      Minutes  15  -      METs  2.5  -        Home Exercise Plan   Plans to continue exercise at  Home (comment) walking at home  Home (comment) walking at home      Frequency  Add 2 additional days to program exercise sessions.  Add 2 additional days to program exercise sessions.      Initial Home Exercises Provided  11/20/17  11/20/17         Exercise Comments: Exercise Comments    Row Name 11/06/17 1650           Exercise Comments  First full day of exercise!  Patient was oriented to gym and equipment including functions, settings, policies, and procedures.  Patient's individual exercise prescription and treatment plan were reviewed.  All starting workloads were established based on the results of the 6 minute walk test done at initial orientation visit.  The plan for exercise progression was also introduced and progression will be customized based on patient's performance and goals.          Exercise Goals and Review: Exercise Goals    Row Name 10/30/17 1353             Exercise Goals   Increase Physical Activity  Yes       Intervention  Provide advice, education, support and counseling about physical activity/exercise needs.;Develop an individualized exercise prescription for aerobic and resistive training based on initial evaluation findings, risk stratification, comorbidities and participant's personal goals.       Expected Outcomes  Short Term: Attend rehab  on a regular basis to increase amount of physical activity.;Long Term: Add in home exercise to make exercise part of routine and to increase amount of physical activity.;Long Term: Exercising regularly at least 3-5 days a week.       Increase Strength and Stamina  Yes  Intervention  Provide advice, education, support and counseling about physical activity/exercise needs.;Develop an individualized exercise prescription for aerobic and resistive training based on initial evaluation findings, risk stratification, comorbidities and participant's personal goals.       Expected Outcomes  Short Term: Increase workloads from initial exercise prescription for resistance, speed, and METs.;Short Term: Perform resistance training exercises routinely during rehab and add in resistance training at home;Long Term: Improve cardiorespiratory fitness, muscular endurance and strength as measured by increased METs and functional capacity (6MWT)       Able to understand and use rate of perceived exertion (RPE) scale  Yes       Intervention  Provide education and explanation on how to use RPE scale       Expected Outcomes  Short Term: Able to use RPE daily in rehab to express subjective intensity level;Long Term:  Able to use RPE to guide intensity level when exercising independently       Able to understand and use Dyspnea scale  Yes       Intervention  Provide education and explanation on how to use Dyspnea scale       Expected Outcomes  Short Term: Able to use Dyspnea scale daily in rehab to express subjective sense of shortness of breath during exertion;Long Term: Able to use Dyspnea scale to guide intensity level when exercising independently       Knowledge and understanding of Target Heart Rate Range (THRR)  Yes       Intervention  Provide education and explanation of THRR including how the numbers were predicted and where they are located for reference       Expected Outcomes  Short Term: Able to state/look up  THRR;Short Term: Able to use daily as guideline for intensity in rehab;Long Term: Able to use THRR to govern intensity when exercising independently       Able to check pulse independently  Yes       Intervention  Provide education and demonstration on how to check pulse in carotid and radial arteries.;Review the importance of being able to check your own pulse for safety during independent exercise       Expected Outcomes  Short Term: Able to explain why pulse checking is important during independent exercise;Long Term: Able to check pulse independently and accurately       Understanding of Exercise Prescription  Yes       Intervention  Provide education, explanation, and written materials on patient's individual exercise prescription       Expected Outcomes  Short Term: Able to explain program exercise prescription;Long Term: Able to explain home exercise prescription to exercise independently          Exercise Goals Re-Evaluation : Exercise Goals Re-Evaluation    Row Name 11/06/17 1651 11/12/17 1638 11/20/17 1653 11/25/17 1601 12/12/17 1225     Exercise Goal Re-Evaluation   Exercise Goals Review  Understanding of Exercise Prescription;Knowledge and understanding of Target Heart Rate Range (THRR);Able to understand and use rate of perceived exertion (RPE) scale  Increase Physical Activity;Increase Strength and Stamina;Able to understand and use rate of perceived exertion (RPE) scale;Knowledge and understanding of Target Heart Rate Range (THRR)  Increase Physical Activity;Increase Strength and Stamina  Increase Physical Activity;Able to understand and use rate of perceived exertion (RPE) scale;Knowledge and understanding of Target Heart Rate Range (THRR);Increase Strength and Stamina  Increase Physical Activity;Able to understand and use rate of perceived exertion (RPE) scale;Increase Strength and Stamina;Knowledge and understanding of Target  Heart Rate Range (THRR)   Comments  Reviewed RPE scale,  THR and program prescription with pt today.  Pt voiced understanding and was given a copy of goals to take home.   Pt is tolerating exercise well.  Staff will monitor progress  Reviewed home exercise with pt today.  Pt plans to walk on the treadmill at home 1-2 times a week for exercise.  Reviewed THR, pulse, RPE, sign and symptoms, NTG use, and when to call 911 or MD.  Also discussed weather considerations and indoor options.  Pt voiced understanding.  Guillaume is porgressing well with exercise and improved MET level.  Staff will monitor.  Eldon is progressing well. Staff will review interval training with Pt    Expected Outcomes  Short: Use RPE daily to regulate intensity.  Long: Follow program prescription in THR.  Short - review home exercise Long - Increase overall MET level  Short: walk at home an 1-2 days extra a week. Long: increase home exercise routine independently  Short - Festus will attend regularly  Long - Neeraj will continue to increase MET level  Short - Lonie will add interval training to his program Long - Pt will incrrease overall MET level   Row Name 12/15/17 1634 12/26/17 0934           Exercise Goal Re-Evaluation   Exercise Goals Review  Increase Physical Activity;Able to understand and use rate of perceived exertion (RPE) scale;Increase Strength and Stamina;Knowledge and understanding of Target Heart Rate Range (THRR)  Increase Physical Activity;Able to understand and use rate of perceived exertion (RPE) scale;Knowledge and understanding of Target Heart Rate Range (THRR);Increase Strength and Stamina;Understanding of Exercise Prescription      Comments  Interval training guidelines were discussed with patient.   Anthonee is tolerating exercise well.  Staff will monitor progress.      Expected Outcomes  Short: Start interval training (2 min/30 sec) in cardiac rehab class. Long: Increase strength and stamina as workloads are increased in class.   Short - Cartel will add ibterval training to  program Long - Keiji will exercise regularly on his own         Discharge Exercise Prescription (Final Exercise Prescription Changes): Exercise Prescription Changes - 12/26/17 0900      Response to Exercise   Blood Pressure (Admit)  136/78    Blood Pressure (Exercise)  142/78    Blood Pressure (Exit)  134/72    Heart Rate (Admit)  96 bpm    Heart Rate (Exercise)  121 bpm    Heart Rate (Exit)  87 bpm    Rating of Perceived Exertion (Exercise)  12    Symptoms  none    Duration  Continue with 45 min of aerobic exercise without signs/symptoms of physical distress.    Intensity  THRR unchanged      Progression   Progression  Continue to progress workloads to maintain intensity without signs/symptoms of physical distress.    Average METs  3      Resistance Training   Training Prescription  Yes    Weight  4 lb    Reps  10-15      Interval Training   Interval Training  No      Treadmill   MPH  2    Grade  1.5    Minutes  15    METs  2.95      Recumbant Bike   Level  6    RPM  60  Watts  3215    Minutes  3      Home Exercise Plan   Plans to continue exercise at  Home (comment) walking at home    Frequency  Add 2 additional days to program exercise sessions.    Initial Home Exercises Provided  11/20/17       Nutrition:  Target Goals: Understanding of nutrition guidelines, daily intake of sodium <1515m, cholesterol <2039m calories 30% from fat and 7% or less from saturated fats, daily to have 5 or more servings of fruits and vegetables.  Biometrics: Pre Biometrics - 10/30/17 1353      Pre Biometrics   Height  5' 6.5" (1.689 m)    Weight  232 lb 1.6 oz (105.3 kg)    Waist Circumference  43 inches    Hip Circumference  47 inches    Waist to Hip Ratio  0.91 %    BMI (Calculated)  36.91    Single Leg Stand  4.5 seconds        Nutrition Therapy Plan and Nutrition Goals: Nutrition Therapy & Goals - 11/27/17 1732      Nutrition Therapy   Diet  DM, TLC     Drug/Food Interactions  Purine/Gout    Protein (specify units)  12oz    Fiber  30 grams    Whole Grain Foods  3 servings    Saturated Fats  14 max. grams    Fruits and Vegetables  6 servings/day 8 ideal    Sodium  2000 grams      Personal Nutrition Goals   Nutrition Goal  Eat on a regular and consistent schedule throughout the day in order to prevent episodes of hyper and hypoglycemia and ultimately to have improved BG control over time    Personal Goal #2  Choose snacks that are a combination of a carbohydrate + a protein, such as peanut butter crackers or fruit and nuts    Personal Goal #3  Use the plate method to help you plan meals at home and when eating out. Remember, peas corn and potatoes count as starches too    Comments  He has started to make changes to his diet but DM medication remains unchanges, and as a result, he has recently been experiencing episodes of hypoglycemia      Intervention Plan   Intervention  Prescribe, educate and counsel regarding individualized specific dietary modifications aiming towards targeted core components such as weight, hypertension, lipid management, diabetes, heart failure and other comorbidities.;Nutrition handout(s) given to patient. GOUT nutrition therapy and general meal planning for diabetes handouts provided along with email    Expected Outcomes  Short Term Goal: Understand basic principles of dietary content, such as calories, fat, sodium, cholesterol and nutrients.;Short Term Goal: A plan has been developed with personal nutrition goals set during dietitian appointment.;Long Term Goal: Adherence to prescribed nutrition plan.       Nutrition Assessments: Nutrition Assessments - 10/30/17 1330      MEDFICTS Scores   Pre Score  6       Nutrition Goals Re-Evaluation: Nutrition Goals Re-Evaluation    RoJeannetteame 11/27/17 1743             Goals   Nutrition Goal  Eat on a regular and consistent schedule throughout the day in order to  prevent episodes of hyper and hypoglycemia and ultimately to have improved BG control over time       Comment  He has not been eating consistently, may  not snack, or chooses candy as a snack rather than foods with protein + fiber       Expected Outcome  He will eat every 4-5 hours and choose foods that have complex carbohydrates, protein and fiber. Snacks should be a mix of 1 serving of protein and 1-2 servings of carbohydrate         Personal Goal #2 Re-Evaluation   Personal Goal #2  Choose snacks that are a combination of 1-2 CHO servings + 1 serving protein, such as peanut butter crackers or fruit and nuts         Personal Goal #3 Re-Evaluation   Personal Goal #3  Use the plate method to help you plan meals at home and when eating out. Remember, peas corn and potatoes count as starches too          Nutrition Goals Discharge (Final Nutrition Goals Re-Evaluation): Nutrition Goals Re-Evaluation - 11/27/17 1743      Goals   Nutrition Goal  Eat on a regular and consistent schedule throughout the day in order to prevent episodes of hyper and hypoglycemia and ultimately to have improved BG control over time    Comment  He has not been eating consistently, may not snack, or chooses candy as a snack rather than foods with protein + fiber    Expected Outcome  He will eat every 4-5 hours and choose foods that have complex carbohydrates, protein and fiber. Snacks should be a mix of 1 serving of protein and 1-2 servings of carbohydrate      Personal Goal #2 Re-Evaluation   Personal Goal #2  Choose snacks that are a combination of 1-2 CHO servings + 1 serving protein, such as peanut butter crackers or fruit and nuts      Personal Goal #3 Re-Evaluation   Personal Goal #3  Use the plate method to help you plan meals at home and when eating out. Remember, peas corn and potatoes count as starches too       Psychosocial: Target Goals: Acknowledge presence or absence of significant depression and/or  stress, maximize coping skills, provide positive support system. Participant is able to verbalize types and ability to use techniques and skills needed for reducing stress and depression.   Initial Review & Psychosocial Screening: Initial Psych Review & Screening - 10/30/17 1334      Initial Review   Current issues with  Current Stress Concerns    Source of Stress Concerns  Unable to participate in former interests or hobbies;Unable to perform yard/household activities    Comments  Started back 1/2 days at work after surgery, about to start back full time. 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 He is still limited post surgery, but is anxious to get back to activities.      Family Dynamics   Good Support System?  Yes spouse      Barriers   Psychosocial barriers to participate in program  There are no identifiable barriers or psychosocial needs.;The patient should benefit from training in stress management and relaxation.      Screening Interventions   Interventions  Encouraged to exercise;Provide feedback about the scores to participant;Program counselor consult;To provide support and resources with identified psychosocial needs    Expected Outcomes  Short Term goal: Utilizing psychosocial counselor, staff and physician to assist with identification of specific Stressors or current issues interfering with  healing process. Setting desired goal for each stressor or current issue identified.;Long Term Goal: Stressors or current issues are controlled or eliminated.;Short Term goal: Identification and review with participant of any Quality of Life or Depression concerns found by scoring the questionnaire.;Long Term goal: The participant improves quality of Life and PHQ9 Scores as seen by post scores and/or verbalization of changes       Quality of Life Scores:  Quality of Life - 10/30/17 1340      Quality  of Life Scores   Health/Function Pre  26.4 %    Socioeconomic Pre  29.25 %    Psych/Spiritual Pre  29.14 %    Family Pre  30 %    GLOBAL Pre  28.06 %      Scores of 19 and below usually indicate a poorer quality of life in these areas.  A difference of  2-3 points is a clinically meaningful difference.  A difference of 2-3 points in the total score of the Quality of Life Index has been associated with significant improvement in overall quality of life, self-image, physical symptoms, and general health in studies assessing change in quality of life.  PHQ-9: Recent Review Flowsheet Data    Depression screen [facility] 2/9 10/30/2017   Decreased Interest 0   Down, Depressed, Hopeless 0   PHQ - 2 Score 0   Altered sleeping 0   Tired, decreased energy 0   Change in appetite 0   Feeling bad or failure about yourself  0   Trouble concentrating 0   Moving slowly or fidgety/restless 0   Suicidal thoughts 0   PHQ-9 Score 0     Interpretation of Total Score  Total Score Depression Severity:  1-4 = Minimal depression, 5-9 = Mild depression, 10-14 = Moderate depression, 15-19 = Moderately severe depression, 20-27 = Severe depression   Psychosocial Evaluation and Intervention: Psychosocial Evaluation - 11/10/17 1717      Psychosocial Evaluation & Interventions   Interventions  Encouraged to exercise with the program and follow exercise prescription    Comments  Counselor met with Mr. [facility]) today for initial psychosocial evaluation.  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  He has a strong support system with a spouse of 15 years and his parents who live locally.  Roshun also is actively involved in his local church.  He reports sleeping well and having a good appetite.  Cash denies a history of depression or anxiety or any current symptoms and states he is generally in a positive mood most of the time.  Livio states he has minimal stress in his life - although he started back to work  full-time today he does not anticipate this to be problematic.  Chau has goals to improve his health overall - to increase his stamina and strength.  Staff will follow with him.      Expected Outcomes  Short:  Suhaan will continue to come consistently although he is back at work full time.     Long:  Trampas will begin to eat healthier and to exercise consistently to be healthier overall.      Continue Psychosocial Services   Follow up required by staff       Psychosocial Re-Evaluation: Psychosocial Re-Evaluation    [place] Name 11/21/17 1129             Psychosocial Re-Evaluation   Current issues with  Current Stress Concerns  Comments  Sanuel reports no new stress concerns       Expected Outcomes  Short - Pt will attend HT for exercise and education on stress Long - Pt will kepp stress level low       Interventions  Encouraged to attend Cardiac Rehabilitation for the exercise       Continue Psychosocial Services   Follow up required by staff          Psychosocial Discharge (Final Psychosocial Re-Evaluation): Psychosocial Re-Evaluation - 11/21/17 1129      Psychosocial Re-Evaluation   Current issues with  Current Stress Concerns    Comments  Qunicy reports no new stress concerns    Expected Outcomes  Short - Pt will attend HT for exercise and education on stress Long - Pt will kepp stress level low    Interventions  Encouraged to attend Cardiac Rehabilitation for the exercise    Continue Psychosocial Services   Follow up required by staff       Vocational Rehabilitation: Provide vocational rehab assistance to qualifying candidates.   Vocational Rehab Evaluation & Intervention: Vocational Rehab - 10/30/17 1347      Initial Vocational Rehab Evaluation & Intervention   Assessment shows need for Vocational Rehabilitation  No       Education: Education Goals: Education classes will be provided on a variety of topics geared toward better understanding of heart health and risk  factor modification. Participant will state understanding/return demonstration of topics presented as noted by education test scores.  Learning Barriers/Preferences: Learning Barriers/Preferences - 10/30/17 1346      Learning Barriers/Preferences   Learning Barriers  None    Learning Preferences  Verbal Instruction;Individual Instruction       Education Topics:  AED/CPR: - Group verbal and written instruction with the use of models to demonstrate the basic use of the AED with the basic ABC's of resuscitation.   Cardiac Rehab from 12/29/2017 in [facility] Cardiac and Pulmonary Rehab  Date  12/03/17  Educator  CE  Instruction Review Code  1- Verbalizes Understanding      General Nutrition Guidelines/Fats and Fiber: -Group instruction provided by verbal, written material, models and posters to present the general guidelines for heart healthy nutrition. Gives an explanation and review of dietary fats and fiber.   Controlling Sodium/Reading Food Labels: -Group verbal and written material supporting the discussion of sodium use in heart healthy nutrition. Review and explanation with models, verbal and written materials for utilization of the food label.   Cardiac Rehab from 12/29/2017 in [facility] Cardiac and Pulmonary Rehab  Date  12/15/17  Educator  PI  Instruction Review Code  1- Verbalizes Understanding      Exercise Physiology & General Exercise Guidelines: - Group verbal and written instruction with models to review the exercise physiology of the cardiovascular system and associated critical values. Provides general exercise guidelines with specific guidelines to those with heart or lung disease.    Aerobic Exercise & Resistance Training: - Gives group verbal and written instruction on the various components of exercise. Focuses on aerobic and resistive training programs and the benefits of this training and how to safely progress through these programs..   Cardiac Rehab from 12/29/2017 in  [facility] Cardiac and Pulmonary Rehab  Date  12/29/17  Educator  AS  Instruction Review Code  1- Verbalizes Understanding      Flexibility, Balance, Mind/Body Relaxation: Provides group verbal/written instruction on the benefits of flexibility and balance training, including mind/body exercise modes such as  yoga, pilates and tai chi.  Demonstration and skill practice provided.   Stress and Anxiety: - Provides group verbal and written instruction about the health risks of elevated stress and causes of high stress.  Discuss the correlation between heart/lung disease and anxiety and treatment options. Review healthy ways to manage with stress and anxiety.   Cardiac Rehab from 12/29/2017 in [facility] Cardiac and Pulmonary Rehab  Date  11/12/17  Educator  [facility]  Instruction Review Code  1- Verbalizes Understanding      Depression: - Provides group verbal and written instruction on the correlation between heart/lung disease and depressed mood, treatment options, and the stigmas associated with seeking treatment.   Cardiac Rehab from 12/29/2017 in [facility] Cardiac and Pulmonary Rehab  Date  12/24/17  Educator  [facility]  Instruction Review Code  1- Verbalizes Understanding      Anatomy & Physiology of the Heart: - Group verbal and written instruction and models provide basic cardiac anatomy and physiology, with the coronary electrical and arterial systems. Review of Valvular disease and Heart Failure   Cardiac Rehab from 12/29/2017 in [facility] Cardiac and Pulmonary Rehab  Date  11/10/17  Educator  CE  Instruction Review Code  1- Verbalizes Understanding      Cardiac Procedures: - Group verbal and written instruction to review commonly prescribed medications for heart disease. Reviews the medication, class of the drug, and side effects. Includes the steps to properly store meds and maintain the prescription regimen. (beta blockers and nitrates)   Cardiac Rehab from 12/29/2017 in [facility] Cardiac and Pulmonary Rehab   Date  11/24/17  Educator  CE  Instruction Review Code  1- Verbalizes Understanding      Cardiac Medications I: - Group verbal and written instruction to review commonly prescribed medications for heart disease. Reviews the medication, class of the drug, and side effects. Includes the steps to properly store meds and maintain the prescription regimen.   Cardiac Rehab from 12/29/2017 in [facility] Cardiac and Pulmonary Rehab  Date  11/17/17  Educator  CE  Instruction Review Code  1- Verbalizes Understanding      Cardiac Medications II: -Group verbal and written instruction to review commonly prescribed medications for heart disease. Reviews the medication, class of the drug, and side effects. (all other drug classes)   Cardiac Rehab from 12/29/2017 in [facility] Cardiac and Pulmonary Rehab  Date  12/17/17  Educator  SB  Instruction Review Code  1- Verbalizes Understanding       Go Sex-Intimacy & Heart Disease, Get SMART - Goal Setting: - Group verbal and written instruction through game format to discuss heart disease and the return to sexual intimacy. Provides group verbal and written material to discuss and apply goal setting through the application of the S.M.A.R.T. Method.   Cardiac Rehab from 12/29/2017 in [facility] Cardiac and Pulmonary Rehab  Date  11/24/17  Educator  CE  Instruction Review Code  1- Verbalizes Understanding      Other Matters of the Heart: - Provides group verbal, written materials and models to describe Stable Angina and Peripheral Artery. Includes description of the disease process and treatment options available to the cardiac patient.   Cardiac Rehab from 12/29/2017 in [facility] Cardiac and Pulmonary Rehab  Date  11/10/17  Educator  CE  Instruction Review Code  1- Verbalizes Understanding      Exercise & Equipment Safety: - Individual verbal instruction and demonstration of equipment use and safety with use of the equipment.   Cardiac Rehab from  12/29/2017 in [facility]  Cardiac and Pulmonary Rehab  Date  10/30/17  Educator  [facility]  Instruction Review Code  1- Verbalizes Understanding      Infection Prevention: - Provides verbal and written material to individual with discussion of infection control including proper hand washing and proper equipment cleaning during exercise session.   Cardiac Rehab from 12/29/2017 in [facility] Cardiac and Pulmonary Rehab  Date  10/30/17  Educator  [facility]  Instruction Review Code  1- Verbalizes Understanding      Falls Prevention: - Provides verbal and written material to individual with discussion of falls prevention and safety.   Cardiac Rehab from 12/29/2017 in [facility] Cardiac and Pulmonary Rehab  Date  10/30/17  Educator  [facility]  Instruction Review Code  1- Verbalizes Understanding      Diabetes: - Individual verbal and written instruction to review signs/symptoms of diabetes, desired ranges of glucose level fasting, after meals and with exercise. Acknowledge that pre and post exercise glucose checks will be done for 3 sessions at entry of program.   Cardiac Rehab from 12/29/2017 in [facility] Cardiac and Pulmonary Rehab  Date  10/30/17  Educator  [facility]  Instruction Review Code  1- Verbalizes Understanding      Know Your Numbers and Risk Factors: -Group verbal and written instruction about important numbers in your health.  Discussion of what are risk factors and how they play a role in the disease process.  Review of Cholesterol, Blood Pressure, Diabetes, and BMI and the role they play in your overall health.   Cardiac Rehab from 12/29/2017 in [facility] Cardiac and Pulmonary Rehab  Date  12/17/17  Educator  SB  Instruction Review Code  1- Verbalizes Understanding      Sleep Hygiene: -Provides group verbal and written instruction about how sleep can affect your health.  Define sleep hygiene, discuss sleep cycles and impact of sleep habits. Review good sleep hygiene tips.    Cardiac Rehab from 12/29/2017 in [facility] Cardiac and Pulmonary Rehab   Date  11/26/17  Educator  [facility]  Instruction Review Code  1- Verbalizes Understanding      Other: -Provides group and verbal instruction on various topics (see comments)   Knowledge Questionnaire Score: Knowledge Questionnaire Score - 10/30/17 1347      Knowledge Questionnaire Score   Pre Score  25/28 correct answers reviewed with Azar       Core Components/Risk Factors/Patient Goals at Admission: Personal Goals and Risk Factors at Admission - 10/30/17 1322      Core Components/Risk Factors/Patient Goals on Admission    Weight Management  Yes;Weight Loss    Intervention  Weight Management: Develop a combined nutrition and exercise program designed to reach desired caloric intake, while maintaining appropriate intake of nutrient and fiber, sodium and fats, and appropriate energy expenditure required for the weight goal.;Weight Management/Obesity: Establish reasonable short term and long term weight goals.    Admit Weight  222 lb (100.7 kg)    Goal Weight: Short Term  217 lb (98.4 kg)    Goal Weight: Long Term  200 lb (90.7 kg)    Expected Outcomes  Short Term: Continue to assess and modify interventions until short term weight is achieved;Long Term: Adherence to nutrition and physical activity/exercise program aimed toward attainment of established weight goal;Weight Loss: Understanding of general recommendations for a balanced deficit meal plan, which promotes 1-2 lb weight loss per week and includes a negative energy balance of [phone] kcal/d;Understanding recommendations for meals to include 15-35% energy  as protein, 25-35% energy from fat, 35-60% energy from carbohydrates, less than 222m of dietary cholesterol, 20-35 gm of total fiber daily;Understanding of distribution of calorie intake throughout the day with the consumption of 4-5 meals/snacks    Diabetes  Yes    Intervention  Provide education about signs/symptoms and action to take for hypo/hyperglycemia.;Provide education  about proper nutrition, including hydration, and aerobic/resistive exercise prescription along with prescribed medications to achieve blood glucose in normal ranges: Fasting glucose 65-99 mg/dL    Expected Outcomes  Short Term: Participant verbalizes understanding of the signs/symptoms and immediate care of hyper/hypoglycemia, proper foot care and importance of medication, aerobic/resistive exercise and nutrition plan for blood glucose control.;Long Term: Attainment of HbA1C < 7%.    Hypertension  Yes    Intervention  Provide education on lifestyle modifcations including regular physical activity/exercise, weight management, moderate sodium restriction and increased consumption of fresh fruit, vegetables, and low fat dairy, alcohol moderation, and smoking cessation.;Monitor prescription use compliance.    Expected Outcomes  Short Term: Continued assessment and intervention until BP is < 140/926mHG in hypertensive participants. < 130/8061mG in hypertensive participants with diabetes, heart failure or chronic kidney disease.;Long Term: Maintenance of blood pressure at goal levels.    Lipids  Yes    Intervention  Provide education and support for participant on nutrition & aerobic/resistive exercise along with prescribed medications to achieve LDL <47m36mDL >40mg21m Expected Outcomes  Short Term: Participant states understanding of desired cholesterol values and is compliant with medications prescribed. Participant is following exercise prescription and nutrition guidelines.;Long Term: Cholesterol controlled with medications as prescribed, with individualized exercise RX and with personalized nutrition plan. Value goals: LDL < 47mg,73m > 40 mg.       Core Components/Risk Factors/Patient Goals Review:  Goals and Risk Factor Review    Row Name 11/21/17 1125             Core Components/Risk Factors/Patient Goals Review   Personal Goals Review  Weight  Management/Obesity;Lipids;Diabetes;Hypertension;Stress       Review  Pt i smonitoring BG 4 times per day - ave FBG is 80-110.  BP has been good in HT - not monitoring at home but taking all meds as directed.  He has lost 20 lb by eating chicken and veggies and avoiding red meat, bread and potatoes.  Pt states he has little stress now that he is managing heart health.  he says "I feel better when I leave exercise than when I started'.  [facility].  He stil has chest numbness that his Dr said may take a year to go away.       Expected Outcomes  Short - Staff will schedule Jakarri with RD to fine tune diet, he will continue exercise  Long - Pt will maintain healthy eating and exercise habits          Core Components/Risk Factors/Patient Goals at Discharge (Final Review):  Goals and Risk Factor Review - 11/21/17 1125      Core Components/Risk Factors/Patient Goals Review   Personal Goals Review  Weight Management/Obesity;Lipids;Diabetes;Hypertension;Stress    Review  Pt i smonitoring BG 4 times per day - ave FBG is 80-110.  BP has been good in HT - not monitoring at home but taking all meds as directed.  He has lost 20 lb by eating chicken and veggies and avoiding red meat, bread and potatoes.  Pt states he has little stress  now that he is managing heart health.  he says "I feel better when I leave exercise than when I started'.  He sees a gout specialist at [facility] in May.  He stil has chest numbness that his Dr said may take a year to go away.    Expected Outcomes  Short - Staff will schedule Thi with RD to fine tune diet, he will continue exercise  Long - Pt will maintain healthy eating and exercise habits       ITP Comments: ITP Comments    Row Name 10/30/17 1308 11/05/17 6837 11/27/17 1643 12/03/17 0634 12/31/17 0638   ITP Comments  Med Review completed. Initial ITP created. Diagnosis can be found in Care Everywhere 10/06/17  30 day review comleted. Continue with ITP  unless directed changes per Medical Director   New to program  Blood sugar low times 2 visits.  Note sent  to  Ausencio's MD today  30 day review. Continue with ITP unless directed changes per Medical Director  30 day review. Continue with ITP unless directed changes per Medical Director review      Comments:

## 2018-01-01 VITALS — Ht 66.5 in | Wt 233.5 lb

## 2018-01-01 DIAGNOSIS — Z48812 Encounter for surgical aftercare following surgery on the circulatory system: Secondary | ICD-10-CM | POA: Diagnosis not present

## 2018-01-01 DIAGNOSIS — Z951 Presence of aortocoronary bypass graft: Secondary | ICD-10-CM

## 2018-01-01 NOTE — Progress Notes (Signed)
Daily Session Note  Patient Details  Name: Randy Luna MRN: [id number] Date of Birth: 11/09/1960 Referring Provider:     Cardiac Rehab from 10/30/2017 in [facility] Cardiac and Pulmonary Rehab  Referring Provider  Sabra Heck      Encounter Date: 01/01/2018  Check In: Session Check In - 01/01/18 1626      Check-In   Location  [facility]-Cardiac & Pulmonary Rehab    Staff Present  Justin Mend Jaci Carrel, BS, ACSM CEP, Exercise Physiologist;Meredith Sherryll Burger, RN BSN    Supervising physician immediately available to respond to emergencies  See telemetry face sheet for immediately available ER MD    Medication changes reported      No    Fall or balance concerns reported     No    Tobacco Cessation  No Change    Warm-up and Cool-down  Performed on first and last piece of equipment    Resistance Training Performed  Yes    VAD Patient?  No      Pain Assessment   Currently in Pain?  No/denies          Social History   Tobacco Use  Smoking Status Never Smoker  Smokeless Tobacco Never Used    Goals Met:  Independence with exercise equipment Exercise tolerated well No report of cardiac concerns or symptoms Strength training completed today  Goals Unmet:  Not Applicable  Comments:  [place] Name 10/30/17 1355 01/01/18 1636       6 Minute Walk   Distance  1186 feet  1350 feet    Distance % Change  -  14 %    Distance Feet Change  -  164 ft    Walk Time  6 minutes  6 minutes    # of Rest Breaks  0  0    MPH  2.25  2.55    METS  3.43  3.67    RPE  12  12    Perceived Dyspnea   0  0    VO2 Peak  12.01  12.83    Symptoms  No  No    Resting HR  82 bpm  104 bpm    Resting BP  144/94  128/76    Resting Oxygen Saturation   99 %  98 %    Exercise Oxygen Saturation  during 6 min walk  100 %  99 %    Max Ex. HR  127 bpm  116 bpm    Max Ex. BP  158/84  168/86    2 Minute Post BP  142/86  -     Pt able to follow exercise prescription today without complaint.   Will continue to monitor for progression.   Dr. Emily Filbert is Medical Director for [place] [facility].

## 2018-01-05 ENCOUNTER — Encounter: Payer: Managed Care, Other (non HMO) | Admitting: *Deleted

## 2018-01-05 DIAGNOSIS — Z951 Presence of aortocoronary bypass graft: Secondary | ICD-10-CM

## 2018-01-05 DIAGNOSIS — Z48812 Encounter for surgical aftercare following surgery on the circulatory system: Secondary | ICD-10-CM | POA: Diagnosis not present

## 2018-01-05 NOTE — Progress Notes (Signed)
Daily Session Note  Patient Details  Name: Randy Luna MRN: [id number] Date of Birth: Jul 16, 1960 Referring Provider:     Cardiac Rehab from 10/30/2017 in [facility] Cardiac and Pulmonary Rehab  Referring Provider  Sabra Heck      Encounter Date: 01/05/2018  Check In: Session Check In - 01/05/18 1648      Check-In   Location  [facility]-Cardiac & Pulmonary Rehab    Staff Present  Heath Lark, RN, BSN, Laveda Norman, BS, ACSM CEP, Exercise Physiologist;Amanda Oletta Darter, [place], ACSM CEP, Exercise Physiologist    Medication changes reported      No    Fall or balance concerns reported     No    Tobacco Cessation  No Change    Warm-up and Cool-down  Performed on first and last piece of equipment    Resistance Training Performed  Yes    VAD Patient?  No      Pain Assessment   Currently in Pain?  No/denies    Multiple Pain Sites  No          Social History   Tobacco Use  Smoking Status Never Smoker  Smokeless Tobacco Never Used    Goals Met:  Independence with exercise equipment Exercise tolerated well No report of cardiac concerns or symptoms Strength training completed today  Goals Unmet:  Not Applicable  Comments: Pt able to follow exercise prescription today without complaint.  Will continue to monitor for progression.    Dr. Emily Filbert is Medical Director for [place] [facility].

## 2018-01-07 DIAGNOSIS — Z48812 Encounter for surgical aftercare following surgery on the circulatory system: Secondary | ICD-10-CM | POA: Diagnosis not present

## 2018-01-07 DIAGNOSIS — Z951 Presence of aortocoronary bypass graft: Secondary | ICD-10-CM

## 2018-01-07 LAB — GLUCOSE, CAPILLARY
Glucose-Capillary: 77 mg/dL (ref 70–99)
Glucose-Capillary: 83 mg/dL (ref 70–99)
Glucose-Capillary: 89 mg/dL (ref 70–99)

## 2018-01-07 NOTE — Progress Notes (Signed)
Discharge Progress Report  Patient Details  Name: Randy Luna MRN: [id number] Date of Birth: 1960-07-17 Referring Provider:     Cardiac Rehab from 10/30/2017 in [facility]  Referring Provider  Sabra Heck         Number of [place]  Reason for Discharge:  Patient reached a stable level of exercise. Patient independent in their exercise. Patient has met program and personal goals.  Smoking History:  Social History   Tobacco Use  Smoking Status Never Smoker  Smokeless Tobacco Never Used    Diagnosis:  S/P CABG x 4  ADL [facility]:   Initial Exercise Prescription: Initial Exercise Prescription - 10/30/17 1300      Date of Initial Exercise RX and Referring Provider   Date  10/30/17    Referring Provider  Sabra Heck      Treadmill   MPH  2.5    Grade  1.5    Minutes  15    METs  3.43      Recumbant Bike   Level  6    RPM  60    Watts  40    Minutes  15    METs  3.4      T5 Nustep   Level  3    SPM  80    Minutes  15    METs  3.4      Prescription Details   Frequency (times per week)  3    Duration  Progress to 45 minutes of aerobic exercise without signs/symptoms of physical distress      Intensity   THRR 40-80% of Max Heartrate  114-148    Ratings of Perceived Exertion  11-13    Perceived Dyspnea  0-4      Resistance Training   Training Prescription  Yes    Weight  4 lb    Reps  10-15       Discharge Exercise Prescription (Final Exercise Prescription Changes): Exercise Prescription Changes - 01/07/18 1500      Response to Exercise   Blood Pressure (Admit)  140/82    Blood Pressure (Exercise)  150/80    Blood Pressure (Exit)  122/80    Heart Rate (Admit)  94 bpm    Heart Rate (Exercise)  105 bpm    Heart Rate (Exit)  91 bpm    Rating of Perceived Exertion (Exercise)  12    Symptoms  none    Duration  Continue with 45 min of aerobic exercise without signs/symptoms of physical distress.    Intensity  THRR unchanged      Progression   Progression  Continue to progress workloads to maintain intensity without signs/symptoms of physical distress.    Average METs  3      Resistance Training   Training Prescription  Yes    Weight  4 lb    Reps  10-15      Interval Training   Interval Training  No      Treadmill   MPH  2    Grade  1.5    Minutes  15    METs  2.95      T5 Nustep   Level  3    SPM  80    Minutes  15    METs  2.6      Home Exercise Plan   Plans to continue exercise at  Home (comment) walking at home    Frequency  Add 2 additional days to  program exercise sessions.    Initial Home Exercises Provided  11/20/17       Functional Capacity: 6 Minute Walk    Row Name 10/30/17 1355 01/01/18 1636       6 Minute Walk   Distance  1186 feet  1350 feet    Distance % Change  -  14 %    Distance Feet Change  -  164 ft    Walk Time  6 minutes  6 minutes    # of Rest Breaks  0  0    MPH  2.25  2.55    METS  3.43  3.67    RPE  12  12    Perceived Dyspnea   0  0    VO2 Peak  12.01  12.83    Symptoms  No  No    Resting HR  82 bpm  104 bpm    Resting BP  144/94  128/76    Resting Oxygen Saturation   99 %  98 %    Exercise Oxygen Saturation  during 6 min walk  100 %  99 %    Max Ex. HR  127 bpm  116 bpm    Max Ex. BP  158/84  168/86    2 Minute Post BP  142/86  -       Psychological, QOL, Others - Outcomes: PHQ 2/9: Depression screen [facility] 2/9 01/07/2018 10/30/2017  Decreased Interest 0 0  Down, Depressed, Hopeless 0 0  PHQ - 2 Score 0 0  Altered sleeping 0 0  Tired, decreased energy 0 0  Change in appetite 0 0  Feeling bad or failure about yourself  0 0  Trouble concentrating 0 0  Moving slowly or fidgety/restless 0 0  Suicidal thoughts - 0  PHQ-9 Score 0 0    Quality of Life: Quality of Life - 01/07/18 1758      Quality of Life   Select  Quality of Life      Quality of Life Scores   Health/Function Post  28.77 %    Socioeconomic Post  30 %    Psych/Spiritual Post   27.43 %    Family Post  30 %    GLOBAL Post  28.93 %       Personal Goals: Goals established at orientation with interventions provided to work toward goal. Personal Goals and Risk Factors at Admission - 10/30/17 1322      Core Components/Risk Factors/Patient Goals on Admission    Weight Management  Yes;Weight Loss    Intervention  Weight Management: Develop a combined nutrition and exercise program designed to reach desired caloric intake, while maintaining appropriate intake of nutrient and fiber, sodium and fats, and appropriate energy expenditure required for the weight goal.;Weight Management/Obesity: Establish reasonable short term and long term weight goals.    Admit Weight  222 lb (100.7 kg)    Goal Weight: Short Term  217 lb (98.4 kg)    Goal Weight: Long Term  200 lb (90.7 kg)    Expected Outcomes  Short Term: Continue to assess and modify interventions until short term weight is achieved;Long Term: Adherence to nutrition and physical activity/exercise program aimed toward attainment of established weight goal;Weight Loss: Understanding of general recommendations for a balanced deficit meal plan, which promotes 1-2 lb weight loss per week and includes a negative energy balance of [phone] kcal/d;Understanding recommendations for meals to include 15-35% energy as protein, 25-35% energy from fat, 35-60% energy  from carbohydrates, less than 260m of dietary cholesterol, 20-35 gm of total fiber daily;Understanding of distribution of calorie intake throughout the day with the consumption of 4-5 meals/snacks    Diabetes  Yes    Intervention  Provide education about signs/symptoms and action to take for hypo/hyperglycemia.;Provide education about proper nutrition, including hydration, and aerobic/resistive exercise prescription along with prescribed medications to achieve blood glucose in normal ranges: Fasting glucose 65-99 mg/dL    Expected Outcomes  Short Term: Participant verbalizes  understanding of the signs/symptoms and immediate care of hyper/hypoglycemia, proper foot care and importance of medication, aerobic/resistive exercise and nutrition plan for blood glucose control.;Long Term: Attainment of HbA1C < 7%.    Hypertension  Yes    Intervention  Provide education on lifestyle modifcations including regular physical activity/exercise, weight management, moderate sodium restriction and increased consumption of fresh fruit, vegetables, and low fat dairy, alcohol moderation, and smoking cessation.;Monitor prescription use compliance.    Expected Outcomes  Short Term: Continued assessment and intervention until BP is < 140/937mHG in hypertensive participants. < 130/8050mG in hypertensive participants with diabetes, heart failure or chronic kidney disease.;Long Term: Maintenance of blood pressure at goal levels.    Lipids  Yes    Intervention  Provide education and support for participant on nutrition & aerobic/resistive exercise along with prescribed medications to achieve LDL <76m12mDL >40mg84m Expected Outcomes  Short Term: Participant states understanding of desired cholesterol values and is compliant with medications prescribed. Participant is following exercise prescription and nutrition guidelines.;Long Term: Cholesterol controlled with medications as prescribed, with individualized exercise RX and with personalized nutrition plan. Value goals: LDL < 76mg,54m > 40 mg.        Personal Goals Discharge: Goals and Risk Factor Review    Row Name 11/21/17 1125             Core Components/Risk Factors/Patient Goals Review   Personal Goals Review  Weight Management/Obesity;Lipids;Diabetes;Hypertension;Stress       Review  Pt i smonitoring BG 4 times per day - ave FBG is 80-110.  BP has been good in HT - not monitoring at home but taking all meds as directed.  He has lost 20 lb by eating chicken and veggies and avoiding red meat, bread and potatoes.  Pt states he has  little stress now that he is managing heart health.  he says "I feel better when I leave exercise than when I started'.  [facility]  He stil has chest numbness that his Dr said may take a year to go away.       Expected Outcomes  Short - Staff will schedule Vandy with RD to fine tune diet, he will continue exercise  Long - Pt will maintain healthy eating and exercise habits          Exercise Goals and Review: Exercise Goals    Row Name 10/30/17 1353             Exercise Goals   Increase Physical Activity  Yes       Intervention  Provide advice, education, support and counseling about physical activity/exercise needs.;Develop an individualized exercise prescription for aerobic and resistive training based on initial evaluation findings, risk stratification, comorbidities and participant's personal goals.       Expected Outcomes  Short Term: Attend rehab on a regular basis to increase amount of physical activity.;Long Term: Add in home exercise to make exercise part of  routine and to increase amount of physical activity.;Long Term: Exercising regularly at least 3-5 days a week.       Increase Strength and Stamina  Yes       Intervention  Provide advice, education, support and counseling about physical activity/exercise needs.;Develop an individualized exercise prescription for aerobic and resistive training based on initial evaluation findings, risk stratification, comorbidities and participant's personal goals.       Expected Outcomes  Short Term: Increase workloads from initial exercise prescription for resistance, speed, and METs.;Short Term: Perform resistance training exercises routinely during rehab and add in resistance training at home;Long Term: Improve cardiorespiratory fitness, muscular endurance and strength as measured by increased METs and functional capacity (6MWT)       Able to understand and use rate of perceived exertion (RPE) scale  Yes        Intervention  Provide education and explanation on how to use RPE scale       Expected Outcomes  Short Term: Able to use RPE daily in rehab to express subjective intensity level;Long Term:  Able to use RPE to guide intensity level when exercising independently       Able to understand and use Dyspnea scale  Yes       Intervention  Provide education and explanation on how to use Dyspnea scale       Expected Outcomes  Short Term: Able to use Dyspnea scale daily in rehab to express subjective sense of shortness of breath during exertion;Long Term: Able to use Dyspnea scale to guide intensity level when exercising independently       Knowledge and understanding of Target Heart Rate Range (THRR)  Yes       Intervention  Provide education and explanation of THRR including how the numbers were predicted and where they are located for reference       Expected Outcomes  Short Term: Able to state/look up THRR;Short Term: Able to use daily as guideline for intensity in rehab;Long Term: Able to use THRR to govern intensity when exercising independently       Able to check pulse independently  Yes       Intervention  Provide education and demonstration on how to check pulse in carotid and radial arteries.;Review the importance of being able to check your own pulse for safety during independent exercise       Expected Outcomes  Short Term: Able to explain why pulse checking is important during independent exercise;Long Term: Able to check pulse independently and accurately       Understanding of Exercise Prescription  Yes       Intervention  Provide education, explanation, and written materials on patient's individual exercise prescription       Expected Outcomes  Short Term: Able to explain program exercise prescription;Long Term: Able to explain home exercise prescription to exercise independently          Nutrition & Weight - Outcomes: Pre Biometrics - 10/30/17 1353      Pre Biometrics   Height  5' 6.5"  (1.689 m)    Weight  232 lb 1.6 oz (105.3 kg)    Waist Circumference  43 inches    Hip Circumference  47 inches    Waist to Hip Ratio  0.91 %    BMI (Calculated)  36.91    Single Leg Stand  4.5 seconds      Post Biometrics - 01/01/18 [place]  Height  5' 6.5" (1.689 m)    Weight  233 lb 8 oz (105.9 kg)    Waist Circumference  43 inches    Hip Circumference  47 inches    Waist to Hip Ratio  0.91 %    BMI (Calculated)  37.13       Nutrition: Nutrition Therapy & Goals - 11/27/17 1732      Nutrition Therapy   Diet  DM, TLC    Drug/Food Interactions  Purine/Gout    Protein (specify units)  12oz    Fiber  30 grams    Whole Grain Foods  3 servings    Saturated Fats  14 max. grams    Fruits and Vegetables  6 servings/day 8 ideal    Sodium  2000 grams      Personal Nutrition Goals   Nutrition Goal  Eat on a regular and consistent schedule throughout the day in order to prevent episodes of hyper and hypoglycemia and ultimately to have improved BG control over time    Personal Goal #2  Choose snacks that are a combination of a carbohydrate + a protein, such as peanut butter crackers or fruit and nuts    Personal Goal #3  Use the plate method to help you plan meals at home and when eating out. Remember, peas corn and potatoes count as starches too    Comments  He has started to make changes to his diet but DM medication remains unchanges, and as a result, he has recently been experiencing episodes of hypoglycemia      Intervention Plan   Intervention  Prescribe, educate and counsel regarding individualized specific dietary modifications aiming towards targeted core components such as weight, hypertension, lipid management, diabetes, heart failure and other comorbidities.;Nutrition handout(s) given to patient. GOUT nutrition therapy and general meal planning for diabetes handouts provided along with email    Expected Outcomes  Short Term Goal: Understand basic  principles of dietary content, such as calories, fat, sodium, cholesterol and nutrients.;Short Term Goal: A plan has been developed with personal nutrition goals set during dietitian appointment.;Long Term Goal: Adherence to prescribed nutrition plan.       Nutrition Discharge: Nutrition Assessments - 01/07/18 1758      MEDFICTS Scores   Post Score  21       Education Questionnaire Score: Knowledge Questionnaire Score - 01/07/18 1758      Knowledge Questionnaire Score   Post Score  26/28       Goals reviewed with patient; copy given to patient.

## 2018-01-07 NOTE — Progress Notes (Signed)
Daily Session Note  Patient Details  Name: Randy Luna MRN: [id number] Date of Birth: 1960-08-12 Referring Provider:     Cardiac Rehab from 10/30/2017 in [facility]  Referring Provider  Sabra Heck      Encounter Date: 01/07/2018  Check In: Session Check In - 01/07/18 1739      Check-In   Location  [facility]-Cardiac & Pulmonary Rehab    Staff Present  Nyoka Cowden, RN, BSN, MA;Meredith Sherryll Burger, RN Vickki Hearing, BA, ACSM CEP, Exercise Physiologist    Supervising physician immediately available to respond to emergencies  See telemetry face sheet for immediately available ER MD    Medication changes reported      No    Fall or balance concerns reported     No    Warm-up and Cool-down  Performed on first and last piece of equipment    Resistance Training Performed  Yes    VAD Patient?  No    PAD/SET Patient?  No      Pain Assessment   Currently in Pain?  No/denies    Multiple Pain Sites  No        Exercise Prescription Changes - 01/07/18 1500      Response to Exercise   Blood Pressure (Admit)  140/82    Blood Pressure (Exercise)  150/80    Blood Pressure (Exit)  122/80    Heart Rate (Admit)  94 bpm    Heart Rate (Exercise)  105 bpm    Heart Rate (Exit)  91 bpm    Rating of Perceived Exertion (Exercise)  12    Symptoms  none    Duration  Continue with 45 min of aerobic exercise without signs/symptoms of physical distress.    Intensity  THRR unchanged      Progression   Progression  Continue to progress workloads to maintain intensity without signs/symptoms of physical distress.    Average METs  3      Resistance Training   Training Prescription  Yes    Weight  4 lb    Reps  10-15      Interval Training   Interval Training  No      Treadmill   MPH  2    Grade  1.5    Minutes  15    METs  2.95      T5 Nustep   Level  3    SPM  80    Minutes  15    METs  2.6      Home Exercise Plan   Plans to continue exercise at  Home (comment)  walking at home    Frequency  Add 2 additional days to program exercise sessions.    Initial Home Exercises Provided  11/20/17       Social History   Tobacco Use  Smoking Status Never Smoker  Smokeless Tobacco Never Used    Goals Met:  Independence with exercise equipment Exercise tolerated well No report of cardiac concerns or symptoms Strength training completed today  Goals Unmet:  Not Applicable  Comments:  Randy Luna graduated today from  rehab with 36 sessions completed.  Details of the patient's exercise prescription and what He needs to do in order to continue the prescription and progress were discussed with patient.  Patient was given a copy of prescription and goals.  Patient verbalized understanding.  Randy Luna plans to continue to exercise by biking and joining fitness center.    Dr. Emily Filbert  is Medical Director for [facility] and [facility]. 

## 2018-01-07 NOTE — Progress Notes (Signed)
Cardiac Individual Treatment Plan  Patient Details  Name: Randy Luna MRN: [id number] Date of Birth: Dec 12, 1960 Referring Provider:     Cardiac Rehab from 10/30/2017 in [facility] Cardiac and Pulmonary Rehab  Referring Provider  Sabra Heck      Initial Encounter Date:    Cardiac Rehab from 10/30/2017 in [facility] Cardiac and Pulmonary Rehab  Date  10/30/17      Visit Diagnosis: S/P CABG x 4  Patient's Home Medications on Admission:  Current Outpatient Medications:  .  allopurinol (ZYLOPRIM) 100 MG tablet, Take 1 tablet (100 mg total) by mouth daily. (Patient not taking: Reported on 10/30/2017), Disp: 30 tablet, Rfl: 0 .  allopurinol (ZYLOPRIM) 100 MG tablet, Take 100 mg by mouth 2 (two) times daily., Disp: , Rfl:  .  aspirin 325 MG tablet, Take 650 mg by mouth daily., Disp: , Rfl:  .  Cyanocobalamin (VITAMIN B12) 3000 MCG SUBL, Place 3,000 mcg under the tongue 2 (two) times a week., Disp: , Rfl:  .  cyclobenzaprine (FLEXERIL) 10 MG tablet, Take 1 tablet (10 mg total) by mouth 3 (three) times daily as needed., Disp: 15 tablet, Rfl: 0 .  HUMALOG KWIKPEN 100 UNIT/ML KiwkPen, Inject 12 Units into the skin 3 (three) times daily., Disp: , Rfl:  .  Iron-Vitamin C (VITRON-C) 65-125 MG TABS, Take 65 mg by mouth daily., Disp: , Rfl:  .  LANTUS SOLOSTAR 100 UNIT/ML Solostar Pen, Inject 24 Units into the skin at bedtime. , Disp: , Rfl:  .  metFORMIN (GLUCOPHAGE) 1000 MG tablet, Take 1,000 mg by mouth 2 (two) times daily with a meal. , Disp: , Rfl:  .  metoprolol tartrate (LOPRESSOR) 25 MG tablet, Take 25 mg by mouth daily., Disp: , Rfl:  .  nitroGLYCERIN (NITROGLYN) 2 % ointment, Apply 0.5 inches topically every 6 (six) hours. (Patient not taking: Reported on 10/30/2017), Disp: 30 g, Rfl: 0 .  ranitidine (ZANTAC) 150 MG tablet, Take 150 mg by mouth as needed., Disp: , Rfl:  .  rosuvastatin (CRESTOR) 20 MG tablet, Take 20 mg by mouth daily., Disp: , Rfl:  .  traMADol (ULTRAM) 50 MG tablet, Take 1 tablet (50 mg  total) by mouth every 12 (twelve) hours as needed., Disp: 12 tablet, Rfl: 0  Past Medical History: Past Medical History:  Diagnosis Date  . Diabetes mellitus without complication ([place])   . Hypertension     Tobacco Use: Social History   Tobacco Use  Smoking Status Never Smoker  Smokeless Tobacco Never Used    Labs: Recent Review Scientist, physiological    Labs for ITP Cardiac and Pulmonary Rehab Latest Ref Rng & Units 12/03/2016 09/21/2017 09/22/2017   Cholestrol 0 - 200 mg/dL - - 237(H)   LDLCALC 0 - 99 mg/dL - - 141(H)   HDL >40 mg/dL - - 36(L)   Trlycerides <150 mg/dL - - 301(H)   Hemoglobin A1c 4.8 - 5.6 % 11.3(H) 10.6(H) -       Exercise Target Goals:    Exercise Program Goal: Individual exercise prescription set using results from initial 6 min walk test and THRR while considering  patient's activity barriers and safety.   Exercise Prescription Goal: Initial exercise prescription builds to 30-45 minutes a day of aerobic activity, 2-3 days per week.  Home exercise guidelines will be given to patient during program as part of exercise prescription that the participant will acknowledge.  Activity Barriers & Risk Stratification: Activity Barriers & Cardiac Risk Stratification - 10/30/17 1348  Activity Barriers & Cardiac Risk Stratification   Activity Barriers  Joint Problems Randy Luna struggles with gout, that usually affects his feet and knees.    Cardiac Risk Stratification  Moderate       6 Minute Walk: 6 Minute Walk    Row Name 10/30/17 1355 01/01/18 1636       6 Minute Walk   Distance  1186 feet  1350 feet    Distance % Change  -  14 %    Distance Feet Change  -  164 ft    Walk Time  6 minutes  6 minutes    # of Rest Breaks  0  0    MPH  2.25  2.55    METS  3.43  3.67    RPE  12  12    Perceived Dyspnea   0  0    VO2 Peak  12.01  12.83    Symptoms  No  No    Resting HR  82 bpm  104 bpm    Resting BP  144/94  128/76    Resting Oxygen Saturation   99 %  98 %     Exercise Oxygen Saturation  during 6 min walk  100 %  99 %    Max Ex. HR  127 bpm  116 bpm    Max Ex. BP  158/84  168/86    2 Minute Post BP  142/86  -       Oxygen Initial Assessment:   Oxygen Re-Evaluation:   Oxygen Discharge (Final Oxygen Re-Evaluation):   Initial Exercise Prescription: Initial Exercise Prescription - 10/30/17 1300      Date of Initial Exercise RX and Referring Provider   Date  10/30/17    Referring Provider  Sabra Heck      Treadmill   MPH  2.5    Grade  1.5    Minutes  15    METs  3.43      Recumbant Bike   Level  6    RPM  60    Watts  40    Minutes  15    METs  3.4      T5 Nustep   Level  3    SPM  80    Minutes  15    METs  3.4      Prescription Details   Frequency (times per week)  3    Duration  Progress to 45 minutes of aerobic exercise without signs/symptoms of physical distress      Intensity   THRR 40-80% of Max Heartrate  114-148    Ratings of Perceived Exertion  11-13    Perceived Dyspnea  0-4      Resistance Training   Training Prescription  Yes    Weight  4 lb    Reps  10-15       Perform Capillary Blood Glucose checks as needed.  Exercise Prescription Changes: Exercise Prescription Changes    Row Name 10/30/17 1300 11/12/17 1600 11/20/17 1700 11/25/17 1500 11/25/17 1600     Response to Exercise   Blood Pressure (Admit)  144/94  138/70  -  148/80  -   Blood Pressure (Exercise)  158/84  152/78  -  162/84  -   Blood Pressure (Exit)  142/84  132/84  -  148/82  -   Heart Rate (Admit)  88 bpm  80 bpm  -  87 bpm  -   Heart Rate (Exercise)  127 bpm  123 bpm  -  118 bpm  -   Heart Rate (Exit)  89 bpm  103 bpm  -  95 bpm  -   Oxygen Saturation (Admit)  99 %  -  -  -  -   Oxygen Saturation (Exit)  100 %  -  -  -  -   Rating of Perceived Exertion (Exercise)  12  12  -  12  -   Symptoms  -  none  -  none  -   Duration  -  Continue with 45 min of aerobic exercise without signs/symptoms of physical distress.  -  Continue  with 45 min of aerobic exercise without signs/symptoms of physical distress.  -   Intensity  -  THRR unchanged  -  THRR unchanged  -     Progression   Progression  -  Continue to progress workloads to maintain intensity without signs/symptoms of physical distress.  -  Continue to progress workloads to maintain intensity without signs/symptoms of physical distress.  -   Average METs  -  2.4  -  3.3  -     Resistance Training   Training Prescription  -  Yes  -  Yes  -   Weight  -  4 lb  -  4 lb  -   Reps  -  10-15  -  10-15  -     Treadmill   MPH  -  1.5  -  2  -   Grade  -  1.5  -  1.5  -   Minutes  -  15  -  15  -   METs  -  2.35  -  2.95  -     Recumbant Bike   Level  -  -  -  6  -   RPM  -  -  -  60  -   Watts  -  -  -  54  -   Minutes  -  -  -  15  -   METs  -  -  -  3.6  -     T5 Nustep   Level  -  3  -  -  -   SPM  -  80  -  -  -   Minutes  -  15  -  -  -   METs  -  2.4  -  -  -     Home Exercise Plan   Plans to continue exercise at  -  -  Home (comment) walking at home  -  Home (comment) walking at home   Frequency  -  -  Add 2 additional days to program exercise sessions.  -  Add 2 additional days to program exercise sessions.   Initial Home Exercises Provided  -  -  11/20/17  -  11/20/17   Row Name 12/12/17 1200 12/26/17 0900 01/07/18 1500         Response to Exercise   Blood Pressure (Admit)  144/88  136/78  140/82     Blood Pressure (Exercise)  146/68  142/78  150/80     Blood Pressure (Exit)  130/64  134/72  122/80     Heart Rate (Admit)  107 bpm  96 bpm  94 bpm     Heart Rate (Exercise)  124 bpm  121 bpm  105 bpm  Heart Rate (Exit)  105 bpm  87 bpm  91 bpm     Rating of Perceived Exertion (Exercise)  12  12  12      Symptoms  none  none  none     Duration  Continue with 45 min of aerobic exercise without signs/symptoms of physical distress.  Continue with 45 min of aerobic exercise without signs/symptoms of physical distress.  Continue with 45 min of  aerobic exercise without signs/symptoms of physical distress.     Intensity  THRR unchanged  THRR unchanged  THRR unchanged       Progression   Progression  Continue to progress workloads to maintain intensity without signs/symptoms of physical distress.  Continue to progress workloads to maintain intensity without signs/symptoms of physical distress.  Continue to progress workloads to maintain intensity without signs/symptoms of physical distress.     Average METs  3  3  3        Resistance Training   Training Prescription  Yes  Yes  Yes     Weight  4 lb  4 lb  4 lb     Reps  10-15  10-15  10-15       Interval Training   Interval Training  No  No  No       Treadmill   MPH  2  2  2      Grade  1.5  1.5  1.5     Minutes  15  15  15      METs  2.95  2.95  2.95       Recumbant Bike   Level  6  6  -     RPM  60  60  -     Watts  54  3215  -     Minutes  15  3  -     METs  3.6  -  -       T5 Nustep   Level  3  -  3     SPM  80  -  80     Minutes  15  -  15     METs  2.5  -  2.6       Home Exercise Plan   Plans to continue exercise at  Home (comment) walking at home  Home (comment) walking at home  Home (comment) walking at home     Frequency  Add 2 additional days to program exercise sessions.  Add 2 additional days to program exercise sessions.  Add 2 additional days to program exercise sessions.     Initial Home Exercises Provided  11/20/17  11/20/17  11/20/17        Exercise Comments: Exercise Comments    Row Name 11/06/17 1650           Exercise Comments  First full day of exercise!  Patient was oriented to gym and equipment including functions, settings, policies, and procedures.  Patient's individual exercise prescription and treatment plan were reviewed.  All starting workloads were established based on the results of the 6 minute walk test done at initial orientation visit.  The plan for exercise progression was also introduced and progression will be customized based  on patient's performance and goals.          Exercise Goals and Review: Exercise Goals    Row Name 10/30/17 1353             Exercise Goals   Increase  Physical Activity  Yes       Intervention  Provide advice, education, support and counseling about physical activity/exercise needs.;Develop an individualized exercise prescription for aerobic and resistive training based on initial evaluation findings, risk stratification, comorbidities and participant's personal goals.       Expected Outcomes  Short Term: Attend rehab on a regular basis to increase amount of physical activity.;Long Term: Add in home exercise to make exercise part of routine and to increase amount of physical activity.;Long Term: Exercising regularly at least 3-5 days a week.       Increase Strength and Stamina  Yes       Intervention  Provide advice, education, support and counseling about physical activity/exercise needs.;Develop an individualized exercise prescription for aerobic and resistive training based on initial evaluation findings, risk stratification, comorbidities and participant's personal goals.       Expected Outcomes  Short Term: Increase workloads from initial exercise prescription for resistance, speed, and METs.;Short Term: Perform resistance training exercises routinely during rehab and add in resistance training at home;Long Term: Improve cardiorespiratory fitness, muscular endurance and strength as measured by increased METs and functional capacity (6MWT)       Able to understand and use rate of perceived exertion (RPE) scale  Yes       Intervention  Provide education and explanation on how to use RPE scale       Expected Outcomes  Short Term: Able to use RPE daily in rehab to express subjective intensity level;Long Term:  Able to use RPE to guide intensity level when exercising independently       Able to understand and use Dyspnea scale  Yes       Intervention  Provide education and explanation on how  to use Dyspnea scale       Expected Outcomes  Short Term: Able to use Dyspnea scale daily in rehab to express subjective sense of shortness of breath during exertion;Long Term: Able to use Dyspnea scale to guide intensity level when exercising independently       Knowledge and understanding of Target Heart Rate Range (THRR)  Yes       Intervention  Provide education and explanation of THRR including how the numbers were predicted and where they are located for reference       Expected Outcomes  Short Term: Able to state/look up THRR;Short Term: Able to use daily as guideline for intensity in rehab;Long Term: Able to use THRR to govern intensity when exercising independently       Able to check pulse independently  Yes       Intervention  Provide education and demonstration on how to check pulse in carotid and radial arteries.;Review the importance of being able to check your own pulse for safety during independent exercise       Expected Outcomes  Short Term: Able to explain why pulse checking is important during independent exercise;Long Term: Able to check pulse independently and accurately       Understanding of Exercise Prescription  Yes       Intervention  Provide education, explanation, and written materials on patient's individual exercise prescription       Expected Outcomes  Short Term: Able to explain program exercise prescription;Long Term: Able to explain home exercise prescription to exercise independently          Exercise Goals Re-Evaluation : Exercise Goals Re-Evaluation    Row Name 11/06/17 1651 11/12/17 1638 11/20/17 1653 11/25/17 1601 12/12/17 1225  Exercise Goal Re-Evaluation   Exercise Goals Review  Understanding of Exercise Prescription;Knowledge and understanding of Target Heart Rate Range (THRR);Able to understand and use rate of perceived exertion (RPE) scale  Increase Physical Activity;Increase Strength and Stamina;Able to understand and use rate of perceived exertion  (RPE) scale;Knowledge and understanding of Target Heart Rate Range (THRR)  Increase Physical Activity;Increase Strength and Stamina  Increase Physical Activity;Able to understand and use rate of perceived exertion (RPE) scale;Knowledge and understanding of Target Heart Rate Range (THRR);Increase Strength and Stamina  Increase Physical Activity;Able to understand and use rate of perceived exertion (RPE) scale;Increase Strength and Stamina;Knowledge and understanding of Target Heart Rate Range (THRR)   Comments  Reviewed RPE scale, THR and program prescription with pt today.  Pt voiced understanding and was given a copy of goals to take home.   Pt is tolerating exercise well.  Staff will monitor progress  Reviewed home exercise with pt today.  Pt plans to walk on the treadmill at home 1-2 times a week for exercise.  Reviewed THR, pulse, RPE, sign and symptoms, NTG use, and when to call 911 or MD.  Also discussed weather considerations and indoor options.  Pt voiced understanding.  Jauan is porgressing well with exercise and improved MET level.  Staff will monitor.  Kaelem is progressing well. Staff will review interval training with Pt    Expected Outcomes  Short: Use RPE daily to regulate intensity.  Long: Follow program prescription in THR.  Short - review home exercise Long - Increase overall MET level  Short: walk at home an 1-2 days extra a week. Long: increase home exercise routine independently  Short - Kadrian will attend regularly  Long - Leonides will continue to increase MET level  Short - Nikash will add interval training to his program Long - Pt will incrrease overall MET level   Row Name 12/15/17 1634 12/26/17 0934           Exercise Goal Re-Evaluation   Exercise Goals Review  Increase Physical Activity;Able to understand and use rate of perceived exertion (RPE) scale;Increase Strength and Stamina;Knowledge and understanding of Target Heart Rate Range (THRR)  Increase Physical Activity;Able to  understand and use rate of perceived exertion (RPE) scale;Knowledge and understanding of Target Heart Rate Range (THRR);Increase Strength and Stamina;Understanding of Exercise Prescription      Comments  Interval training guidelines were discussed with patient.   Torry is tolerating exercise well.  Staff will monitor progress.      Expected Outcomes  Short: Start interval training (2 min/30 sec) in cardiac rehab class. Long: Increase strength and stamina as workloads are increased in class.   Short - Jashawn will add ibterval training to program Long - Larnie will exercise regularly on his own         Discharge Exercise Prescription (Final Exercise Prescription Changes): Exercise Prescription Changes - 01/07/18 1500      Response to Exercise   Blood Pressure (Admit)  140/82    Blood Pressure (Exercise)  150/80    Blood Pressure (Exit)  122/80    Heart Rate (Admit)  94 bpm    Heart Rate (Exercise)  105 bpm    Heart Rate (Exit)  91 bpm    Rating of Perceived Exertion (Exercise)  12    Symptoms  none    Duration  Continue with 45 min of aerobic exercise without signs/symptoms of physical distress.    Intensity  THRR unchanged      Progression  Progression  Continue to progress workloads to maintain intensity without signs/symptoms of physical distress.    Average METs  3      Resistance Training   Training Prescription  Yes    Weight  4 lb    Reps  10-15      Interval Training   Interval Training  No      Treadmill   MPH  2    Grade  1.5    Minutes  15    METs  2.95      T5 Nustep   Level  3    SPM  80    Minutes  15    METs  2.6      Home Exercise Plan   Plans to continue exercise at  Home (comment) walking at home    Frequency  Add 2 additional days to program exercise sessions.    Initial Home Exercises Provided  11/20/17       Nutrition:  Target Goals: Understanding of nutrition guidelines, daily intake of sodium <1572m, cholesterol <2064m calories 30% from fat  and 7% or less from saturated fats, daily to have 5 or more servings of fruits and vegetables.  Biometrics: Pre Biometrics - 10/30/17 1353      Pre Biometrics   Height  5' 6.5" (1.689 m)    Weight  232 lb 1.6 oz (105.3 kg)    Waist Circumference  43 inches    Hip Circumference  47 inches    Waist to Hip Ratio  0.91 %    BMI (Calculated)  36.91    Single Leg Stand  4.5 seconds      Post Biometrics - 01/01/18 1635       Post  Biometrics   Height  5' 6.5" (1.689 m)    Weight  233 lb 8 oz (105.9 kg)    Waist Circumference  43 inches    Hip Circumference  47 inches    Waist to Hip Ratio  0.91 %    BMI (Calculated)  37.13       Nutrition Therapy Plan and Nutrition Goals: Nutrition Therapy & Goals - 11/27/17 1732      Nutrition Therapy   Diet  DM, TLC    Drug/Food Interactions  Purine/Gout    Protein (specify units)  12oz    Fiber  30 grams    Whole Grain Foods  3 servings    Saturated Fats  14 max. grams    Fruits and Vegetables  6 servings/day 8 ideal    Sodium  2000 grams      Personal Nutrition Goals   Nutrition Goal  Eat on a regular and consistent schedule throughout the day in order to prevent episodes of hyper and hypoglycemia and ultimately to have improved BG control over time    Personal Goal #2  Choose snacks that are a combination of a carbohydrate + a protein, such as peanut butter crackers or fruit and nuts    Personal Goal #3  Use the plate method to help you plan meals at home and when eating out. Remember, peas corn and potatoes count as starches too    Comments  He has started to make changes to his diet but DM medication remains unchanges, and as a result, he has recently been experiencing episodes of hypoglycemia      Intervention Plan   Intervention  Prescribe, educate and counsel regarding individualized specific dietary modifications aiming towards targeted core components such as  weight, hypertension, lipid management, diabetes, heart failure and other  comorbidities.;Nutrition handout(s) given to patient. GOUT nutrition therapy and general meal planning for diabetes handouts provided along with email    Expected Outcomes  Short Term Goal: Understand basic principles of dietary content, such as calories, fat, sodium, cholesterol and nutrients.;Short Term Goal: A plan has been developed with personal nutrition goals set during dietitian appointment.;Long Term Goal: Adherence to prescribed nutrition plan.       Nutrition Assessments: Nutrition Assessments - 01/07/18 1758      MEDFICTS Scores   Post Score  21       Nutrition Goals Re-Evaluation: Nutrition Goals Re-Evaluation    Industry Name 11/27/17 1743             Goals   Nutrition Goal  Eat on a regular and consistent schedule throughout the day in order to prevent episodes of hyper and hypoglycemia and ultimately to have improved BG control over time       Comment  He has not been eating consistently, may not snack, or chooses candy as a snack rather than foods with protein + fiber       Expected Outcome  He will eat every 4-5 hours and choose foods that have complex carbohydrates, protein and fiber. Snacks should be a mix of 1 serving of protein and 1-2 servings of carbohydrate         Personal Goal #2 Re-Evaluation   Personal Goal #2  Choose snacks that are a combination of 1-2 CHO servings + 1 serving protein, such as peanut butter crackers or fruit and nuts         Personal Goal #3 Re-Evaluation   Personal Goal #3  Use the plate method to help you plan meals at home and when eating out. Remember, peas corn and potatoes count as starches too          Nutrition Goals Discharge (Final Nutrition Goals Re-Evaluation): Nutrition Goals Re-Evaluation - 11/27/17 1743      Goals   Nutrition Goal  Eat on a regular and consistent schedule throughout the day in order to prevent episodes of hyper and hypoglycemia and ultimately to have improved BG control over time    Comment  He has not  been eating consistently, may not snack, or chooses candy as a snack rather than foods with protein + fiber    Expected Outcome  He will eat every 4-5 hours and choose foods that have complex carbohydrates, protein and fiber. Snacks should be a mix of 1 serving of protein and 1-2 servings of carbohydrate      Personal Goal #2 Re-Evaluation   Personal Goal #2  Choose snacks that are a combination of 1-2 CHO servings + 1 serving protein, such as peanut butter crackers or fruit and nuts      Personal Goal #3 Re-Evaluation   Personal Goal #3  Use the plate method to help you plan meals at home and when eating out. Remember, peas corn and potatoes count as starches too       Psychosocial: Target Goals: Acknowledge presence or absence of significant depression and/or stress, maximize coping skills, provide positive support system. Participant is able to verbalize types and ability to use techniques and skills needed for reducing stress and depression.   Initial Review & Psychosocial Screening: Initial Psych Review & Screening - 10/30/17 1334      Initial Review   Current issues with  Current Stress Concerns    Source  of Stress Concerns  Unable to participate in former interests or hobbies;Unable to perform yard/household activities    Comments  Started back 1/2 days at work after surgery, about to start back full time. 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 He is still limited post surgery, but is anxious to get back to activities.      Family Dynamics   Good Support System?  Yes spouse      Barriers   Psychosocial barriers to participate in program  There are no identifiable barriers or psychosocial needs.;The patient should benefit from training in stress management and relaxation.      Screening Interventions   Interventions  Encouraged to exercise;Provide feedback about the scores to  participant;Program counselor consult;To provide support and resources with identified psychosocial needs    Expected Outcomes  Short Term goal: Utilizing psychosocial counselor, staff and physician to assist with identification of specific Stressors or current issues interfering with healing process. Setting desired goal for each stressor or current issue identified.;Long Term Goal: Stressors or current issues are controlled or eliminated.;Short Term goal: Identification and review with participant of any Quality of Life or Depression concerns found by scoring the questionnaire.;Long Term goal: The participant improves quality of Life and PHQ9 Scores as seen by post scores and/or verbalization of changes       Quality of Life Scores:  Quality of Life - 01/07/18 1758      Quality of Life   Select  Quality of Life      Quality of Life Scores   Health/Function Post  28.77 %    Socioeconomic Post  30 %    Psych/Spiritual Post  27.43 %    Family Post  30 %    GLOBAL Post  28.93 %      Scores of 19 and below usually indicate a poorer quality of life in these areas.  A difference of  2-3 points is a clinically meaningful difference.  A difference of 2-3 points in the total score of the Quality of Life Index has been associated with significant improvement in overall quality of life, self-image, physical symptoms, and general health in studies assessing change in quality of life.  PHQ-9: Recent Review Flowsheet Data    Depression screen [facility] 2/9 01/07/2018 10/30/2017   Decreased Interest 0 0   Down, Depressed, Hopeless 0 0   PHQ - 2 Score 0 0   Altered sleeping 0 0   Tired, decreased energy 0 0   Change in appetite 0 0   Feeling bad or failure about yourself  0 0   Trouble concentrating 0 0   Moving slowly or fidgety/restless 0 0   Suicidal thoughts - 0   PHQ-9 Score 0 0     Interpretation of Total Score  Total Score Depression Severity:  1-4 = Minimal depression, 5-9 = Mild depression,  10-14 = Moderate depression, 15-19 = Moderately severe depression, 20-27 = Severe depression   Psychosocial Evaluation and Intervention: Psychosocial Evaluation - 01/07/18 1700      Discharge Psychosocial Assessment & Intervention   Comments  Rudolf is completing this program today.  Counselor followed with him reporting having much more energy; sleeping great; and having learned a lot while here on how to manage his health conditions.  Jahir is having some problems with his low sugar levels and was eating crackers and glucose tablets  while counselor was talking with him.  Roshawn plans to speak with his Dr. about adjusting his insulin levels to help with this soon.  He states that he has been eating much more healthfully and has decreased his gout symptoms with this change of diet.  He reports minimal stress in his life even after going back to work full-time.  Deklen plans to join a gym next week to continue exercising 3-4 times/week as he has developed a routine and wants to maintain his progress and success.  Counselor commended him on his commitment to positive self-care and encouraged him to stay focused and on track!        Psychosocial Re-Evaluation: Psychosocial Re-Evaluation    [place] Name 11/21/17 1129             Psychosocial Re-Evaluation   Current issues with  Current Stress Concerns       Comments  Kue reports no new stress concerns       Expected Outcomes  Short - Pt will attend HT for exercise and education on stress Long - Pt will kepp stress level low       Interventions  Encouraged to attend Cardiac Rehabilitation for the exercise       Continue Psychosocial Services   Follow up required by staff          Psychosocial Discharge (Final Psychosocial Re-Evaluation): Psychosocial Re-Evaluation - 11/21/17 1129      Psychosocial Re-Evaluation   Current issues with  Current Stress Concerns    Comments  Demosthenes reports no new stress concerns    Expected Outcomes  Short - Pt will  attend HT for exercise and education on stress Long - Pt will kepp stress level low    Interventions  Encouraged to attend Cardiac Rehabilitation for the exercise    Continue Psychosocial Services   Follow up required by staff       Vocational Rehabilitation: Provide vocational rehab assistance to qualifying candidates.   Vocational Rehab Evaluation & Intervention: Vocational Rehab - 10/30/17 1347      Initial Vocational Rehab Evaluation & Intervention   Assessment shows need for Vocational Rehabilitation  No       Education: Education Goals: Education classes will be provided on a variety of topics geared toward better understanding of heart health and risk factor modification. Participant will state understanding/return demonstration of topics presented as noted by education test scores.  Learning Barriers/Preferences: Learning Barriers/Preferences - 10/30/17 1346      Learning Barriers/Preferences   Learning Barriers  None    Learning Preferences  Verbal Instruction;Individual Instruction       Education Topics:  AED/CPR: - Group verbal and written instruction with the use of models to demonstrate the basic use of the AED with the basic ABC's of resuscitation.   Cardiac Rehab from 01/07/2018 in [facility] Cardiac and Pulmonary Rehab  Date  12/03/17  Educator  CE  Instruction Review Code  1- Verbalizes Understanding      General Nutrition Guidelines/Fats and Fiber: -Group instruction provided by verbal, written material, models and posters to present the general guidelines for heart healthy nutrition. Gives an explanation and review of dietary fats and fiber.   Controlling Sodium/Reading Food Labels: -Group verbal and written material supporting the discussion of sodium use in heart healthy nutrition. Review and explanation with models, verbal and written materials for utilization of the food label.   Cardiac Rehab from 01/07/2018 in [facility] Cardiac and Pulmonary Rehab  Date   12/15/17  Educator  PI  Instruction Review Code  1- Verbalizes Understanding      Exercise Physiology & General Exercise Guidelines: - Group verbal and written instruction with models to review the exercise physiology of the cardiovascular system and associated critical values. Provides general exercise guidelines with specific guidelines to those with heart or lung disease.    Aerobic Exercise & Resistance Training: - Gives group verbal and written instruction on the various components of exercise. Focuses on aerobic and resistive training programs and the benefits of this training and how to safely progress through these programs..   Cardiac Rehab from 01/07/2018 in [facility] Cardiac and Pulmonary Rehab  Date  12/29/17  Educator  AS  Instruction Review Code  1- Verbalizes Understanding      Flexibility, Balance, Mind/Body Relaxation: Provides group verbal/written instruction on the benefits of flexibility and balance training, including mind/body exercise modes such as yoga, pilates and tai chi.  Demonstration and skill practice provided.   Cardiac Rehab from 01/07/2018 in [facility] Cardiac and Pulmonary Rehab  Date  12/31/17  Educator  AS  Instruction Review Code  1- Verbalizes Understanding      Stress and Anxiety: - Provides group verbal and written instruction about the health risks of elevated stress and causes of high stress.  Discuss the correlation between heart/lung disease and anxiety and treatment options. Review healthy ways to manage with stress and anxiety.   Cardiac Rehab from 01/07/2018 in [facility] Cardiac and Pulmonary Rehab  Date  11/12/17  Educator  [facility]  Instruction Review Code  1- Verbalizes Understanding      Depression: - Provides group verbal and written instruction on the correlation between heart/lung disease and depressed mood, treatment options, and the stigmas associated with seeking treatment.   Cardiac Rehab from 01/07/2018 in [facility] Cardiac and Pulmonary Rehab   Date  12/24/17  Educator  [facility]  Instruction Review Code  1- Verbalizes Understanding      Anatomy & Physiology of the Heart: - Group verbal and written instruction and models provide basic cardiac anatomy and physiology, with the coronary electrical and arterial systems. Review of Valvular disease and Heart Failure   Cardiac Rehab from 01/07/2018 in [facility] Cardiac and Pulmonary Rehab  Date  11/10/17  Educator  CE  Instruction Review Code  1- Verbalizes Understanding      Cardiac Procedures: - Group verbal and written instruction to review commonly prescribed medications for heart disease. Reviews the medication, class of the drug, and side effects. Includes the steps to properly store meds and maintain the prescription regimen. (beta blockers and nitrates)   Cardiac Rehab from 01/07/2018 in [facility] Cardiac and Pulmonary Rehab  Date  11/24/17  Educator  CE  Instruction Review Code  1- Verbalizes Understanding      Cardiac Medications I: - Group verbal and written instruction to review commonly prescribed medications for heart disease. Reviews the medication, class of the drug, and side effects. Includes the steps to properly store meds and maintain the prescription regimen.   Cardiac Rehab from 01/07/2018 in [facility] Cardiac and Pulmonary Rehab  Date  01/05/18  Educator  SB  Instruction Review Code  1- Verbalizes Understanding      Cardiac Medications II: -Group verbal and written instruction to review commonly prescribed medications for heart disease. Reviews the medication, class of the drug, and side effects. (all other drug classes)   Cardiac Rehab from 01/07/2018 in [facility] Cardiac and Pulmonary Rehab  Date  12/17/17  Educator  SB  Instruction  Review Code  1- Verbalizes Understanding       Go Sex-Intimacy & Heart Disease, Get SMART - Goal Setting: - Group verbal and written instruction through game format to discuss heart disease and the return to sexual intimacy. Provides group  verbal and written material to discuss and apply goal setting through the application of the S.M.A.R.T. Method.   Cardiac Rehab from 01/07/2018 in [facility] Cardiac and Pulmonary Rehab  Date  11/24/17  Educator  CE  Instruction Review Code  1- Verbalizes Understanding      Other Matters of the Heart: - Provides group verbal, written materials and models to describe Stable Angina and Peripheral Artery. Includes description of the disease process and treatment options available to the cardiac patient.   Cardiac Rehab from 01/07/2018 in [facility] Cardiac and Pulmonary Rehab  Date  11/10/17  Educator  CE  Instruction Review Code  1- Verbalizes Understanding      Exercise & Equipment Safety: - Individual verbal instruction and demonstration of equipment use and safety with use of the equipment.   Cardiac Rehab from 01/07/2018 in [facility] Cardiac and Pulmonary Rehab  Date  10/30/17  Educator  [facility]  Instruction Review Code  1- Verbalizes Understanding      Infection Prevention: - Provides verbal and written material to individual with discussion of infection control including proper hand washing and proper equipment cleaning during exercise session.   Cardiac Rehab from 01/07/2018 in [facility] Cardiac and Pulmonary Rehab  Date  10/30/17  Educator  [facility]  Instruction Review Code  1- Verbalizes Understanding      Falls Prevention: - Provides verbal and written material to individual with discussion of falls prevention and safety.   Cardiac Rehab from 01/07/2018 in [facility] Cardiac and Pulmonary Rehab  Date  10/30/17  Educator  [facility]  Instruction Review Code  1- Verbalizes Understanding      Diabetes: - Individual verbal and written instruction to review signs/symptoms of diabetes, desired ranges of glucose level fasting, after meals and with exercise. Acknowledge that pre and post exercise glucose checks will be done for 3 sessions at entry of program.   Cardiac Rehab from 01/07/2018 in [facility] Cardiac and Pulmonary  Rehab  Date  10/30/17  Educator  [facility]  Instruction Review Code  1- Verbalizes Understanding      Know Your Numbers and Risk Factors: -Group verbal and written instruction about important numbers in your health.  Discussion of what are risk factors and how they play a role in the disease process.  Review of Cholesterol, Blood Pressure, Diabetes, and BMI and the role they play in your overall health.   Cardiac Rehab from 01/07/2018 in [facility] Cardiac and Pulmonary Rehab  Date  12/17/17  Educator  SB  Instruction Review Code  1- Verbalizes Understanding      Sleep Hygiene: -Provides group verbal and written instruction about how sleep can affect your health.  Define sleep hygiene, discuss sleep cycles and impact of sleep habits. Review good sleep hygiene tips.    Cardiac Rehab from 01/07/2018 in [facility] Cardiac and Pulmonary Rehab  Date  11/26/17  Educator  [facility]  Instruction Review Code  1- Verbalizes Understanding      Other: -Provides group and verbal instruction on various topics (see comments)   Knowledge Questionnaire Score: Knowledge Questionnaire Score - 01/07/18 1758      Knowledge Questionnaire Score   Post Score  26/28       Core Components/Risk Factors/Patient Goals at Admission: Personal Goals and  Risk Factors at Admission - 10/30/17 1322      Core Components/Risk Factors/Patient Goals on Admission    Weight Management  Yes;Weight Loss    Intervention  Weight Management: Develop a combined nutrition and exercise program designed to reach desired caloric intake, while maintaining appropriate intake of nutrient and fiber, sodium and fats, and appropriate energy expenditure required for the weight goal.;Weight Management/Obesity: Establish reasonable short term and long term weight goals.    Admit Weight  222 lb (100.7 kg)    Goal Weight: Short Term  217 lb (98.4 kg)    Goal Weight: Long Term  200 lb (90.7 kg)    Expected Outcomes  Short Term: Continue to assess and modify  interventions until short term weight is achieved;Long Term: Adherence to nutrition and physical activity/exercise program aimed toward attainment of established weight goal;Weight Loss: Understanding of general recommendations for a balanced deficit meal plan, which promotes 1-2 lb weight loss per week and includes a negative energy balance of [phone] kcal/d;Understanding recommendations for meals to include 15-35% energy as protein, 25-35% energy from fat, 35-60% energy from carbohydrates, less than 216m of dietary cholesterol, 20-35 gm of total fiber daily;Understanding of distribution of calorie intake throughout the day with the consumption of 4-5 meals/snacks    Diabetes  Yes    Intervention  Provide education about signs/symptoms and action to take for hypo/hyperglycemia.;Provide education about proper nutrition, including hydration, and aerobic/resistive exercise prescription along with prescribed medications to achieve blood glucose in normal ranges: Fasting glucose 65-99 mg/dL    Expected Outcomes  Short Term: Participant verbalizes understanding of the signs/symptoms and immediate care of hyper/hypoglycemia, proper foot care and importance of medication, aerobic/resistive exercise and nutrition plan for blood glucose control.;Long Term: Attainment of HbA1C < 7%.    Hypertension  Yes    Intervention  Provide education on lifestyle modifcations including regular physical activity/exercise, weight management, moderate sodium restriction and increased consumption of fresh fruit, vegetables, and low fat dairy, alcohol moderation, and smoking cessation.;Monitor prescription use compliance.    Expected Outcomes  Short Term: Continued assessment and intervention until BP is < 140/913mHG in hypertensive participants. < 130/8036mG in hypertensive participants with diabetes, heart failure or chronic kidney disease.;Long Term: Maintenance of blood pressure at goal levels.    Lipids  Yes    Intervention   Provide education and support for participant on nutrition & aerobic/resistive exercise along with prescribed medications to achieve LDL <68m4mDL >40mg69m Expected Outcomes  Short Term: Participant states understanding of desired cholesterol values and is compliant with medications prescribed. Participant is following exercise prescription and nutrition guidelines.;Long Term: Cholesterol controlled with medications as prescribed, with individualized exercise RX and with personalized nutrition plan. Value goals: LDL < 68mg,84m > 40 mg.       Core Components/Risk Factors/Patient Goals Review:  Goals and Risk Factor Review    Row Name 11/21/17 1125             Core Components/Risk Factors/Patient Goals Review   Personal Goals Review  Weight Management/Obesity;Lipids;Diabetes;Hypertension;Stress       Review  Pt i smonitoring BG 4 times per day - ave FBG is 80-110.  BP has been good in HT - not monitoring at home but taking all meds as directed.  He has lost 20 lb by eating chicken and veggies and avoiding red meat, bread and potatoes.  Pt states he has little stress now that he is managing heart health.  he  says "I feel better when I leave exercise than when I started'.  He sees a gout specialist at [facility] in May.  He stil has chest numbness that his Dr said may take a year to go away.       Expected Outcomes  Short - Staff will schedule Lonn with RD to fine tune diet, he will continue exercise  Long - Pt will maintain healthy eating and exercise habits          Core Components/Risk Factors/Patient Goals at Discharge (Final Review):  Goals and Risk Factor Review - 11/21/17 1125      Core Components/Risk Factors/Patient Goals Review   Personal Goals Review  Weight Management/Obesity;Lipids;Diabetes;Hypertension;Stress    Review  Pt i smonitoring BG 4 times per day - ave FBG is 80-110.  BP has been good in HT - not monitoring at home but taking all meds as directed.  He has lost 20 lb by  eating chicken and veggies and avoiding red meat, bread and potatoes.  Pt states he has little stress now that he is managing heart health.  he says "I feel better when I leave exercise than when I started'.  He sees a gout specialist at [facility] in May.  He stil has chest numbness that his Dr said may take a year to go away.    Expected Outcomes  Short - Staff will schedule Marios with RD to fine tune diet, he will continue exercise  Long - Pt will maintain healthy eating and exercise habits       ITP Comments: ITP Comments    Row Name 10/30/17 1308 11/05/17 3735 11/27/17 1643 12/03/17 0634 12/31/17 0638   ITP Comments  Med Review completed. Initial ITP created. Diagnosis can be found in Care Everywhere 10/06/17  30 day review comleted. Continue with ITP unless directed changes per Medical Director   New to program  Blood sugar low times 2 visits.  Note sent  to  Miking's MD today  30 day review. Continue with ITP unless directed changes per Medical Director  30 day review. Continue with ITP unless directed changes per Medical Director review      Comments: Discharge ITP

## 2018-01-07 NOTE — Patient Instructions (Signed)
Discharge Patient Instructions  Patient Details  Name: Randy Luna MRN: [id number] Date of Birth: 1960/10/26 Referring Provider:  Rusty Aus, MD   Number of Visits: 36  Reason for Discharge:  Patient reached a stable level of exercise. Patient independent in their exercise. Patient has met program and personal goals.  Smoking History:  Social History   Tobacco Use  Smoking Status Never Smoker  Smokeless Tobacco Never Used    Diagnosis:  S/P CABG x 4  Initial Exercise Prescription: Initial Exercise Prescription - 10/30/17 1300      Date of Initial Exercise RX and Referring Provider   Date  10/30/17    Referring Provider  Sabra Heck      Treadmill   MPH  2.5    Grade  1.5    Minutes  15    METs  3.43      Recumbant Bike   Level  6    RPM  60    Watts  40    Minutes  15    METs  3.4      T5 Nustep   Level  3    SPM  80    Minutes  15    METs  3.4      Prescription Details   Frequency (times per week)  3    Duration  Progress to 45 minutes of aerobic exercise without signs/symptoms of physical distress      Intensity   THRR 40-80% of Max Heartrate  114-148    Ratings of Perceived Exertion  11-13    Perceived Dyspnea  0-4      Resistance Training   Training Prescription  Yes    Weight  4 lb    Reps  10-15       Discharge Exercise Prescription (Final Exercise Prescription Changes): Exercise Prescription Changes - 01/07/18 1500      Response to Exercise   Blood Pressure (Admit)  140/82    Blood Pressure (Exercise)  150/80    Blood Pressure (Exit)  122/80    Heart Rate (Admit)  94 bpm    Heart Rate (Exercise)  105 bpm    Heart Rate (Exit)  91 bpm    Rating of Perceived Exertion (Exercise)  12    Symptoms  none    Duration  Continue with 45 min of aerobic exercise without signs/symptoms of physical distress.    Intensity  THRR unchanged      Progression   Progression  Continue to progress workloads to maintain intensity without  signs/symptoms of physical distress.    Average METs  3      Resistance Training   Training Prescription  Yes    Weight  4 lb    Reps  10-15      Interval Training   Interval Training  No      Treadmill   MPH  2    Grade  1.5    Minutes  15    METs  2.95      T5 Nustep   Level  3    SPM  80    Minutes  15    METs  2.6      Home Exercise Plan   Plans to continue exercise at  Home (comment) walking at home    Frequency  Add 2 additional days to program exercise sessions.    Initial Home Exercises Provided  11/20/17       Functional Capacity: 6 Minute  Walk    Row Name 10/30/17 1355 01/01/18 1636       6 Minute Walk   Distance  1186 feet  1350 feet    Distance % Change  -  14 %    Distance Feet Change  -  164 ft    Walk Time  6 minutes  6 minutes    # of Rest Breaks  0  0    MPH  2.25  2.55    METS  3.43  3.67    RPE  12  12    Perceived Dyspnea   0  0    VO2 Peak  12.01  12.83    Symptoms  No  No    Resting HR  82 bpm  104 bpm    Resting BP  144/94  128/76    Resting Oxygen Saturation   99 %  98 %    Exercise Oxygen Saturation  during 6 min walk  100 %  99 %    Max Ex. HR  127 bpm  116 bpm    Max Ex. BP  158/84  168/86    2 Minute Post BP  142/86  -       Quality of Life: Quality of Life - 10/30/17 1340      Quality of Life Scores   Health/Function Pre  26.4 %    Socioeconomic Pre  29.25 %    Psych/Spiritual Pre  29.14 %    Family Pre  30 %    GLOBAL Pre  28.06 %       Personal Goals: Goals established at orientation with interventions provided to work toward goal. Personal Goals and Risk Factors at Admission - 10/30/17 1322      Core Components/Risk Factors/Patient Goals on Admission    Weight Management  Yes;Weight Loss    Intervention  Weight Management: Develop a combined nutrition and exercise program designed to reach desired caloric intake, while maintaining appropriate intake of nutrient and fiber, sodium and fats, and appropriate energy  expenditure required for the weight goal.;Weight Management/Obesity: Establish reasonable short term and long term weight goals.    Admit Weight  222 lb (100.7 kg)    Goal Weight: Short Term  217 lb (98.4 kg)    Goal Weight: Long Term  200 lb (90.7 kg)    Expected Outcomes  Short Term: Continue to assess and modify interventions until short term weight is achieved;Long Term: Adherence to nutrition and physical activity/exercise program aimed toward attainment of established weight goal;Weight Loss: Understanding of general recommendations for a balanced deficit meal plan, which promotes 1-2 lb weight loss per week and includes a negative energy balance of [phone] kcal/d;Understanding recommendations for meals to include 15-35% energy as protein, 25-35% energy from fat, 35-60% energy from carbohydrates, less than 230m of dietary cholesterol, 20-35 gm of total fiber daily;Understanding of distribution of calorie intake throughout the day with the consumption of 4-5 meals/snacks    Diabetes  Yes    Intervention  Provide education about signs/symptoms and action to take for hypo/hyperglycemia.;Provide education about proper nutrition, including hydration, and aerobic/resistive exercise prescription along with prescribed medications to achieve blood glucose in normal ranges: Fasting glucose 65-99 mg/dL    Expected Outcomes  Short Term: Participant verbalizes understanding of the signs/symptoms and immediate care of hyper/hypoglycemia, proper foot care and importance of medication, aerobic/resistive exercise and nutrition plan for blood glucose control.;Long Term: Attainment of HbA1C < 7%.    Hypertension  Yes    Intervention  Provide education on lifestyle modifcations including regular physical activity/exercise, weight management, moderate sodium restriction and increased consumption of fresh fruit, vegetables, and low fat dairy, alcohol moderation, and smoking cessation.;Monitor prescription use compliance.     Expected Outcomes  Short Term: Continued assessment and intervention until BP is < 140/44m HG in hypertensive participants. < 130/829mHG in hypertensive participants with diabetes, heart failure or chronic kidney disease.;Long Term: Maintenance of blood pressure at goal levels.    Lipids  Yes    Intervention  Provide education and support for participant on nutrition & aerobic/resistive exercise along with prescribed medications to achieve LDL <7028mHDL >51m64m  Expected Outcomes  Short Term: Participant states understanding of desired cholesterol values and is compliant with medications prescribed. Participant is following exercise prescription and nutrition guidelines.;Long Term: Cholesterol controlled with medications as prescribed, with individualized exercise RX and with personalized nutrition plan. Value goals: LDL < 70mg44mL > 40 mg.        Personal Goals Discharge: Goals and Risk Factor Review - 11/21/17 1125      Core Components/Risk Factors/Patient Goals Review   Personal Goals Review  Weight Management/Obesity;Lipids;Diabetes;Hypertension;Stress    Review  Pt i smonitoring BG 4 times per day - ave FBG is 80-110.  BP has been good in HT - not monitoring at home but taking all meds as directed.  He has lost 20 lb by eating chicken and veggies and avoiding red meat, bread and potatoes.  Pt states he has little stress now that he is managing heart health.  he says "I feel better when I leave exercise than when I started'.  [facility]  He stil has chest numbness that his Dr said may take a year to go away.    Expected Outcomes  Short - Staff will schedule Zyhir with RD to fine tune diet, he will continue exercise  Long - Pt will maintain healthy eating and exercise habits       Exercise Goals and Review: Exercise Goals    Row Name 10/30/17 1353             Exercise Goals   Increase Physical Activity  Yes       Intervention  Provide advice,  education, support and counseling about physical activity/exercise needs.;Develop an individualized exercise prescription for aerobic and resistive training based on initial evaluation findings, risk stratification, comorbidities and participant's personal goals.       Expected Outcomes  Short Term: Attend rehab on a regular basis to increase amount of physical activity.;Long Term: Add in home exercise to make exercise part of routine and to increase amount of physical activity.;Long Term: Exercising regularly at least 3-5 days a week.       Increase Strength and Stamina  Yes       Intervention  Provide advice, education, support and counseling about physical activity/exercise needs.;Develop an individualized exercise prescription for aerobic and resistive training based on initial evaluation findings, risk stratification, comorbidities and participant's personal goals.       Expected Outcomes  Short Term: Increase workloads from initial exercise prescription for resistance, speed, and METs.;Short Term: Perform resistance training exercises routinely during rehab and add in resistance training at home;Long Term: Improve cardiorespiratory fitness, muscular endurance and strength as measured by increased METs and functional capacity (6MWT)       Able to understand and use rate of perceived exertion (RPE) scale  Yes       Intervention  Provide education and explanation on how to use RPE scale       Expected Outcomes  Short Term: Able to use RPE daily in rehab to express subjective intensity level;Long Term:  Able to use RPE to guide intensity level when exercising independently       Able to understand and use Dyspnea scale  Yes       Intervention  Provide education and explanation on how to use Dyspnea scale       Expected Outcomes  Short Term: Able to use Dyspnea scale daily in rehab to express subjective sense of shortness of breath during exertion;Long Term: Able to use Dyspnea scale to guide intensity  level when exercising independently       Knowledge and understanding of Target Heart Rate Range (THRR)  Yes       Intervention  Provide education and explanation of THRR including how the numbers were predicted and where they are located for reference       Expected Outcomes  Short Term: Able to state/look up THRR;Short Term: Able to use daily as guideline for intensity in rehab;Long Term: Able to use THRR to govern intensity when exercising independently       Able to check pulse independently  Yes       Intervention  Provide education and demonstration on how to check pulse in carotid and radial arteries.;Review the importance of being able to check your own pulse for safety during independent exercise       Expected Outcomes  Short Term: Able to explain why pulse checking is important during independent exercise;Long Term: Able to check pulse independently and accurately       Understanding of Exercise Prescription  Yes       Intervention  Provide education, explanation, and written materials on patient's individual exercise prescription       Expected Outcomes  Short Term: Able to explain program exercise prescription;Long Term: Able to explain home exercise prescription to exercise independently          Nutrition & Weight - Outcomes: Pre Biometrics - 10/30/17 1353      Pre Biometrics   Height  5' 6.5" (1.689 m)    Weight  232 lb 1.6 oz (105.3 kg)    Waist Circumference  43 inches    Hip Circumference  47 inches    Waist to Hip Ratio  0.91 %    BMI (Calculated)  36.91    Single Leg Stand  4.5 seconds      Post Biometrics - 01/01/18 1635       Post  Biometrics   Height  5' 6.5" (1.689 m)    Weight  233 lb 8 oz (105.9 kg)    Waist Circumference  43 inches    Hip Circumference  47 inches    Waist to Hip Ratio  0.91 %    BMI (Calculated)  37.13       Nutrition: Nutrition Therapy & Goals - 11/27/17 1732      Nutrition Therapy   Diet  DM, TLC    Drug/Food Interactions   Purine/Gout    Protein (specify units)  12oz    Fiber  30 grams    Whole Grain Foods  3 servings    Saturated Fats  14 max. grams    Fruits and Vegetables  6 servings/day 8 ideal    Sodium  2000 grams  Personal Nutrition Goals   Nutrition Goal  Eat on a regular and consistent schedule throughout the day in order to prevent episodes of hyper and hypoglycemia and ultimately to have improved BG control over time    Personal Goal #2  Choose snacks that are a combination of a carbohydrate + a protein, such as peanut butter crackers or fruit and nuts    Personal Goal #3  Use the plate method to help you plan meals at home and when eating out. Remember, peas corn and potatoes count as starches too    Comments  He has started to make changes to his diet but DM medication remains unchanges, and as a result, he has recently been experiencing episodes of hypoglycemia      Intervention Plan   Intervention  Prescribe, educate and counsel regarding individualized specific dietary modifications aiming towards targeted core components such as weight, hypertension, lipid management, diabetes, heart failure and other comorbidities.;Nutrition handout(s) given to patient. GOUT nutrition therapy and general meal planning for diabetes handouts provided along with email    Expected Outcomes  Short Term Goal: Understand basic principles of dietary content, such as calories, fat, sodium, cholesterol and nutrients.;Short Term Goal: A plan has been developed with personal nutrition goals set during dietitian appointment.;Long Term Goal: Adherence to prescribed nutrition plan.       Nutrition Discharge: Nutrition Assessments - 10/30/17 1330      MEDFICTS Scores   Pre Score  6       Education Questionnaire Score: Knowledge Questionnaire Score - 10/30/17 1347      Knowledge Questionnaire Score   Pre Score  25/28 correct answers reviewed with Nicki Reaper       Goals reviewed with patient; copy given to patient.

## 2018-01-28 ENCOUNTER — Encounter: Payer: Self-pay | Admitting: Hematology and Oncology

## 2018-01-30 ENCOUNTER — Encounter: Payer: Self-pay | Admitting: Hematology and Oncology

## 2018-01-30 ENCOUNTER — Encounter: Payer: Self-pay | Admitting: Urgent Care

## 2018-04-23 IMAGING — CR DG CHEST 2V
1 series · 2 of 2 positions shown · non-contrast
Comparison: Chest x-ray dated December 02, 2016.

CLINICAL DATA: Severe chest pain.

EXAM:
CHEST - 2 VIEW

[Series 1: dg chest 2 view · 0.14mm/px · 2 of 2 slices shown]
[im 1/2]
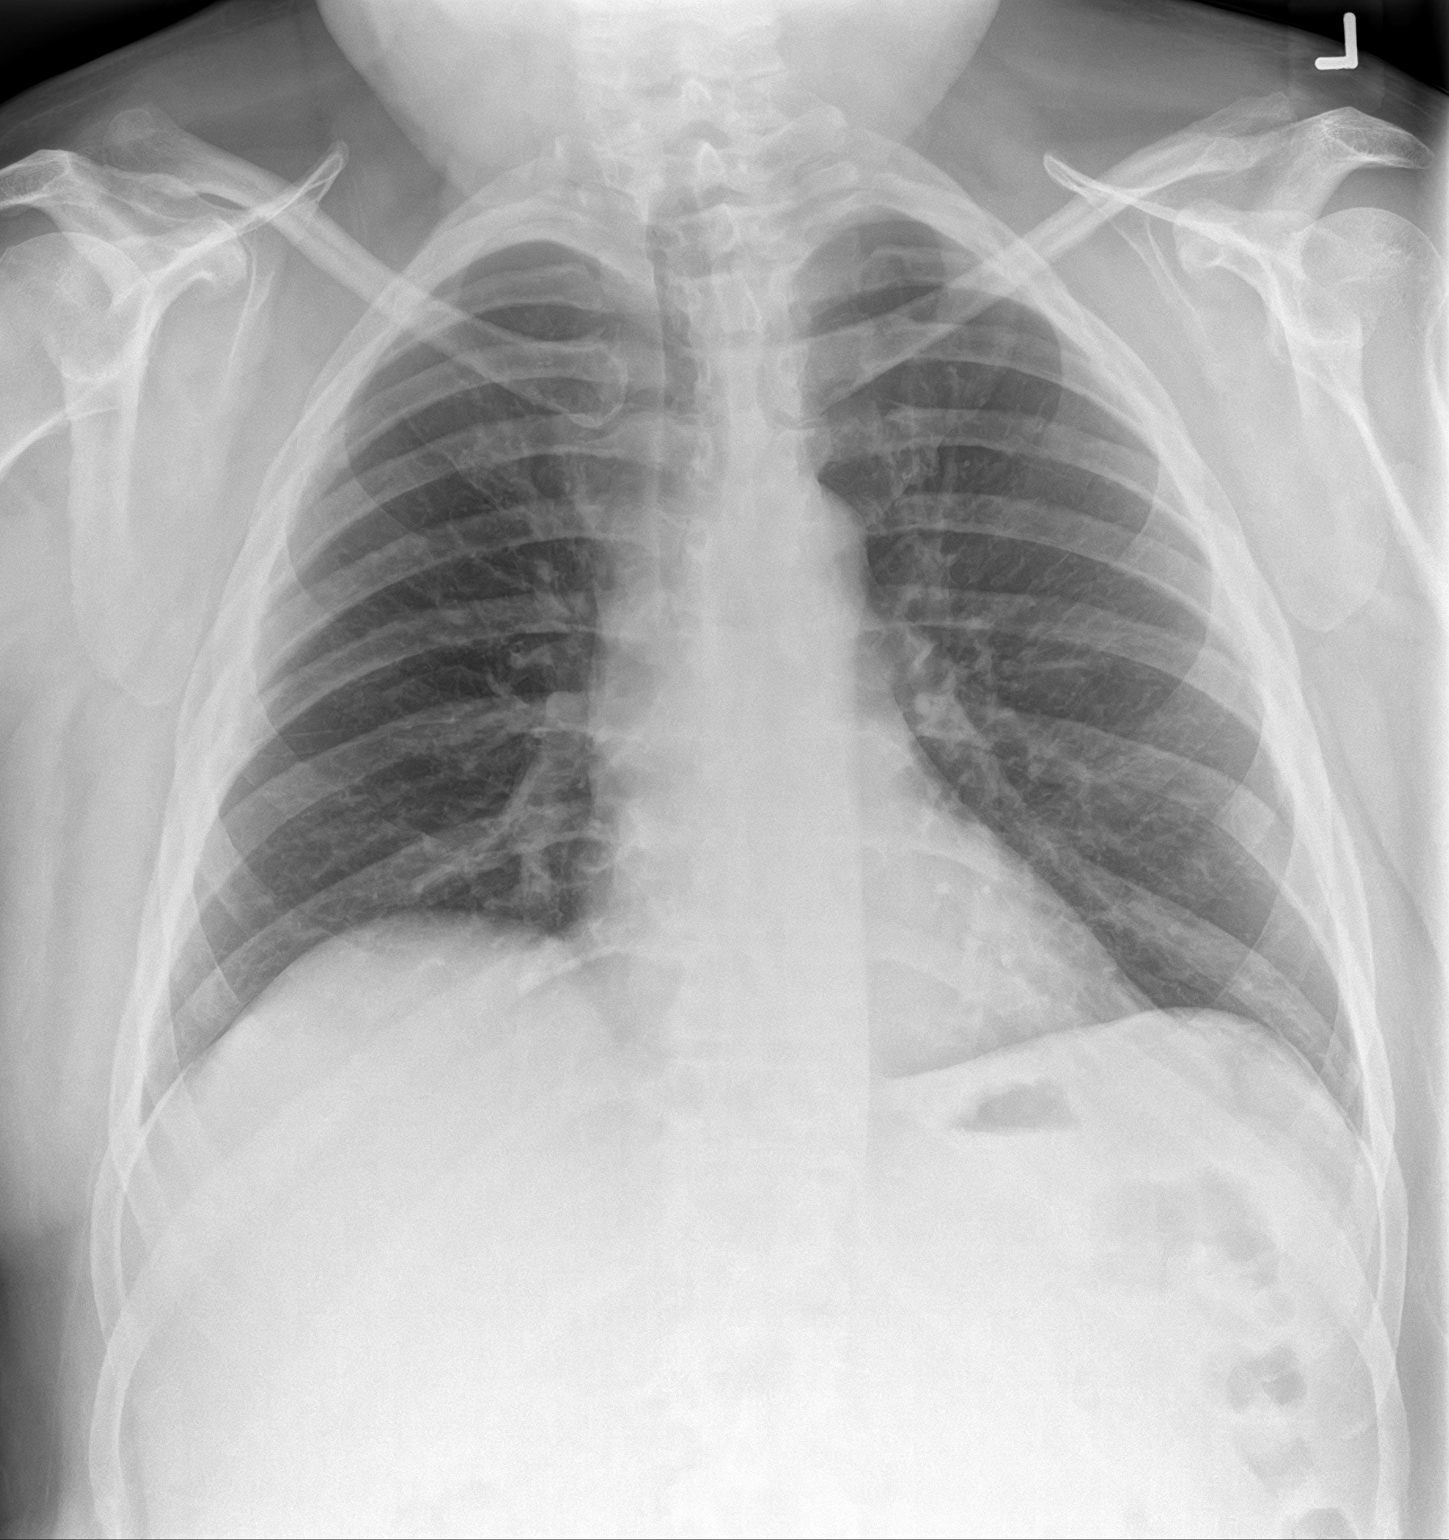
[im 2/2]
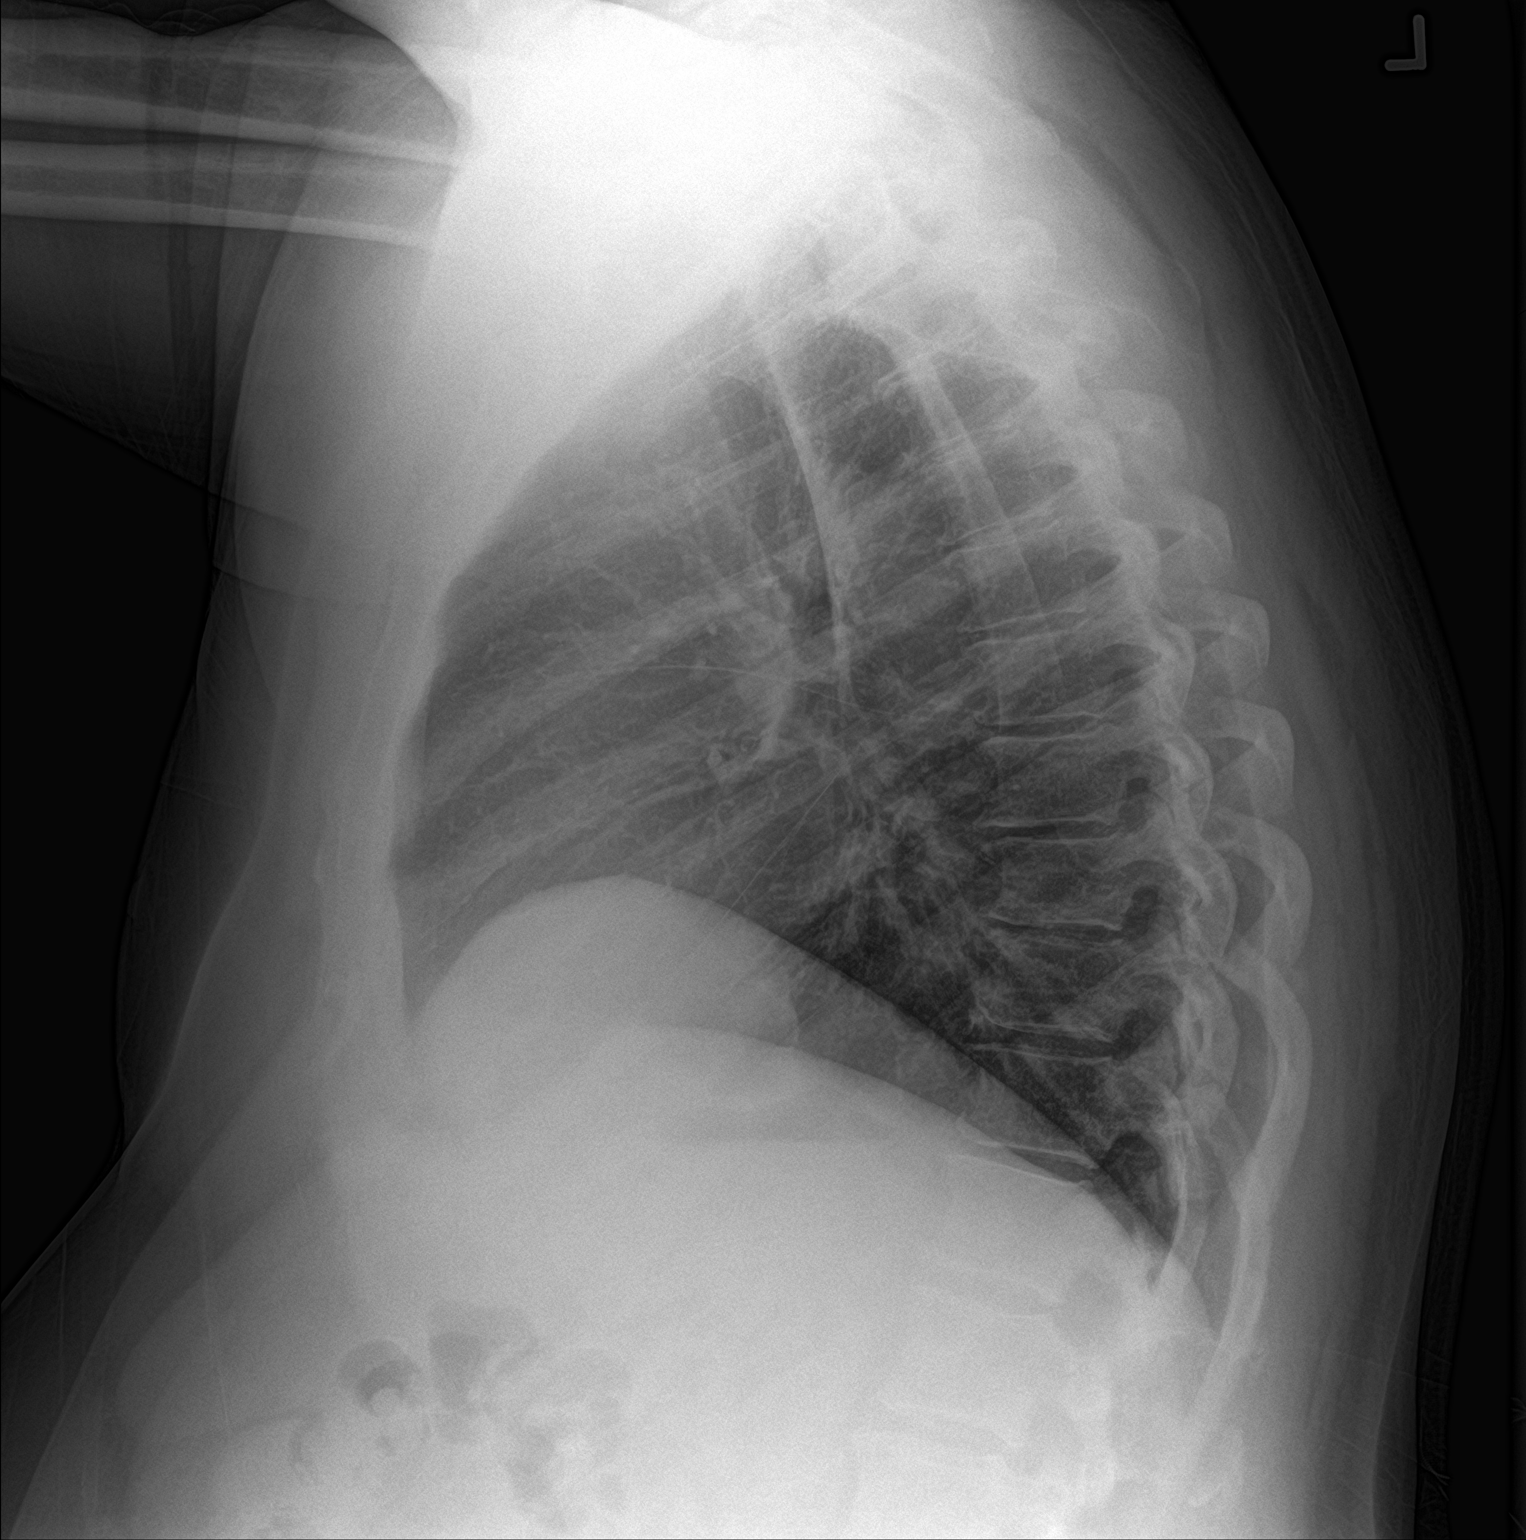

[2 of 2 positions shown; findings below may reference images not displayed]

FINDINGS: The heart size and mediastinal contours are within normal limits.
Both lungs are clear. The visualized skeletal structures are
unremarkable.
IMPRESSION: No active cardiopulmonary disease.
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
## 2022-09-11 ENCOUNTER — Encounter: Payer: Self-pay | Admitting: Intensive Care

## 2022-09-11 ENCOUNTER — Encounter: Payer: Self-pay | Admitting: Emergency Medicine

## 2022-09-11 ENCOUNTER — Emergency Department: Payer: Managed Care, Other (non HMO)

## 2022-09-11 ENCOUNTER — Other Ambulatory Visit: Payer: Self-pay

## 2022-09-11 ENCOUNTER — Ambulatory Visit
Admission: EM | Admit: 2022-09-11 | Discharge: 2022-09-11 | Disposition: A | Discharge status: Home / Self Care | Payer: Managed Care, Other (non HMO)

## 2022-09-11 ENCOUNTER — Emergency Department
Admission: EM | Admit: 2022-09-11 | Discharge: 2022-09-11 | Disposition: A | Discharge status: Home / Self Care | Payer: Managed Care, Other (non HMO) | Attending: Emergency Medicine | Admitting: Emergency Medicine

## 2022-09-11 DIAGNOSIS — W010XXA Fall on same level from slipping, tripping and stumbling without subsequent striking against object, initial encounter: Secondary | ICD-10-CM | POA: Diagnosis not present

## 2022-09-11 DIAGNOSIS — Z794 Long term (current) use of insulin: Secondary | ICD-10-CM | POA: Insufficient documentation

## 2022-09-11 DIAGNOSIS — S0990XA Unspecified injury of head, initial encounter: Secondary | ICD-10-CM

## 2022-09-11 DIAGNOSIS — W19XXXA Unspecified fall, initial encounter: Secondary | ICD-10-CM

## 2022-09-11 DIAGNOSIS — E119 Type 2 diabetes mellitus without complications: Secondary | ICD-10-CM | POA: Diagnosis not present

## 2022-09-11 DIAGNOSIS — Z7982 Long term (current) use of aspirin: Secondary | ICD-10-CM | POA: Insufficient documentation

## 2022-09-11 DIAGNOSIS — I10 Essential (primary) hypertension: Secondary | ICD-10-CM | POA: Diagnosis not present

## 2022-09-11 DIAGNOSIS — S0993XA Unspecified injury of face, initial encounter: Secondary | ICD-10-CM | POA: Diagnosis present

## 2022-09-11 DIAGNOSIS — R2681 Unsteadiness on feet: Secondary | ICD-10-CM

## 2022-09-11 DIAGNOSIS — I2510 Atherosclerotic heart disease of native coronary artery without angina pectoris: Secondary | ICD-10-CM | POA: Insufficient documentation

## 2022-09-11 DIAGNOSIS — Z951 Presence of aortocoronary bypass graft: Secondary | ICD-10-CM | POA: Insufficient documentation

## 2022-09-11 DIAGNOSIS — R531 Weakness: Secondary | ICD-10-CM

## 2022-09-11 DIAGNOSIS — Y92002 Bathroom of unspecified non-institutional (private) residence single-family (private) house as the place of occurrence of the external cause: Secondary | ICD-10-CM | POA: Diagnosis not present

## 2022-09-11 DIAGNOSIS — S0081XA Abrasion of other part of head, initial encounter: Secondary | ICD-10-CM | POA: Diagnosis not present

## 2022-09-11 MED ORDER — ACETAMINOPHEN 325 MG PO TABS
650.0000 mg | ORAL_TABLET | Freq: Once | ORAL | Status: DC
  Filled 2022-09-11: qty 2

## 2022-09-11 NOTE — ED Triage Notes (Signed)
Patient presents after mechanical fall at home. Patient hit left side of head on sink. Denies LOC. Ambulatory with no problems. Some relief with Tylenol.

## 2022-09-11 NOTE — Discharge Instructions (Addendum)
Please go to the emergency department [facility] for evaluation of your head injury coupled with your unsteady gait and your left-sided weakness.  Please go now.

## 2022-09-11 NOTE — ED Triage Notes (Signed)
Pt states he got up this morning and fell going into the bathroom. He hit the left side of his head on the sink. He has an abrasion at the site. He reports a headache after the incident and took tylenol. He denies loss of consciousness.

## 2022-09-11 NOTE — ED Provider Notes (Signed)
[facility]    CSN: [id number] Arrival date & time: 09/11/22  0801      History   Chief Complaint Chief Complaint  Patient presents with   Fall    HPI Randy Luna is a 62 y.o. male.   HPI  62 year old male here for evaluation of head injury.  The patient is here with his spouse and he states that 1 hour prior to arrival he got up to go to the bathroom and his legs gave out causing him to fall and strike his left temple on a porcelain sink.  He denies loss of consciousness, changes in vision, nausea or vomiting, numbness, tingling, or weakness in any of his extremities.  He has a significant past medical history to include type 2 diabetes, NSTEMI with CABG x 4, acute renal failure, and thrombocytosis for which she takes 325 of aspirin daily.  The patient is unsteady on his feet.  Past Medical History:  Diagnosis Date   Diabetes mellitus without complication ([place])    Hypertension     Patient Active Problem List   Diagnosis Date Noted   Goals of care, counseling/discussion 10/22/2017   Thrombocytosis 10/15/2017   Anemia 10/15/2017   B12 deficiency 10/10/2017   S/P coronary artery bypass graft x 4 09/26/2017   Coronary artery disease involving native coronary artery of native heart with angina pectoris ([place]) 09/24/2017   NSTEMI (non-ST elevated myocardial infarction) ([place]) 09/23/2017   Chest pain 09/21/2017   Acute renal failure (ARF) ([place]) 12/02/2016   Chronic gouty arthropathy without tophi 11/25/2016   Type 2 diabetes mellitus, with long-term current use of insulin ([place]) 10/31/2016   Essential hypertension 09/02/2014   Hyperlipidemia, mixed 09/02/2014    Past Surgical History:  Procedure Laterality Date   addenoids     APPENDECTOMY     brain tumor removed     Cardiac bypass  March 15, 20199   CABG X 4   finger amputation repair     HERNIA REPAIR  2004   LEFT HEART CATH AND CORONARY ANGIOGRAPHY N/A 09/22/2017   Procedure: LEFT HEART CATH AND  CORONARY ANGIOGRAPHY;  Surgeon: Teodoro Spray, MD;  Location: [facility];  Service: Cardiovascular;  Laterality: N/A;       Home Medications    Prior to Admission medications   Medication Sig Start Date End Date Taking? Authorizing Provider  allopurinol (ZYLOPRIM) 100 MG tablet Take 100 mg by mouth 2 (two) times daily.   Yes [provider]  aspirin 325 MG tablet Take 650 mg by mouth daily.   Yes [provider]  Cyanocobalamin (VITAMIN B12) 3000 MCG SUBL Place 3,000 mcg under the tongue 2 (two) times a week.   Yes [provider]  HUMALOG KWIKPEN 100 UNIT/ML KiwkPen Inject 12 Units into the skin 3 (three) times daily. 10/01/17  Yes [provider]  Iron-Vitamin C (VITRON-C) 65-125 MG TABS Take 65 mg by mouth daily.   Yes [provider]  LANTUS SOLOSTAR 100 UNIT/ML Solostar Pen Inject 24 Units into the skin at bedtime.  11/21/16  Yes [provider]  metFORMIN (GLUCOPHAGE) 1000 MG tablet Take 1,000 mg by mouth 2 (two) times daily with a meal.  10/06/17  Yes [provider]  metoprolol tartrate (LOPRESSOR) 25 MG tablet Take 1 tablet by mouth 2 (two) times daily. 06/04/22  Yes [provider]  nitroGLYCERIN (NITROGLYN) 2 % ointment Apply 0.5 inches topically every 6 (six) hours. 09/22/17  Yes Mody,  Sital, MD  olmesartan (BENICAR) 40 MG tablet Take by mouth. 06/04/22  Yes [provider]  ranitidine (ZANTAC) 150 MG tablet Take 150 mg by mouth as needed. 10/01/17  Yes [provider]  rosuvastatin (CRESTOR) 20 MG tablet Take 20 mg by mouth daily. 10/06/17  Yes [provider]  cyclobenzaprine (FLEXERIL) 10 MG tablet Take 1 tablet (10 mg total) by mouth 3 (three) times daily as needed. 11/08/17   Sable Feil, PA-C  magnesium oxide (MAG-OX) 400 MG tablet Take by mouth.    [provider]  traMADol (ULTRAM) 50 MG tablet Take 1 tablet (50 mg total) by mouth every 12 (twelve) hours as  needed. 11/08/17   Sable Feil, PA-C    Family History Family History  Problem Relation Age of Onset   Deep vein thrombosis Father    Diabetes Father    Diabetes Mother    Cancer Maternal Grandmother     Social History Social History   Tobacco Use   Smoking status: Never   Smokeless tobacco: Never  Vaping Use   Vaping Use: Never used  Substance Use Topics   Alcohol use: No   Drug use: No     Allergies   Patient has no known allergies.   Review of Systems Review of Systems  Constitutional:  Negative for fever.  Eyes:  Negative for visual disturbance.  Gastrointestinal:  Negative for nausea and vomiting.  Skin:  Positive for wound.  Neurological:  Positive for headaches. Negative for dizziness, weakness and numbness.     Physical Exam Triage Vital Signs ED Triage Vitals  Enc Vitals Group     BP 09/11/22 0815 138/71     Pulse Rate 09/11/22 0815 64     Resp 09/11/22 0815 16     Temp 09/11/22 0815 97.9 F (36.6 C)     Temp Source 09/11/22 0815 Oral     SpO2 09/11/22 0815 95 %     Weight --      Height --      Head Circumference --      Peak Flow --      Pain Score 09/11/22 0811 3     Pain Loc --      Pain Edu? --      Excl. in [place]? --    No data found.  Updated Vital Signs BP 138/71 (BP Location: Left Arm)   Pulse 64   Temp 97.9 F (36.6 C) (Oral)   Resp 16   SpO2 95%   Visual Acuity Right Eye Distance:   Left Eye Distance:   Bilateral Distance:    Right Eye Near:   Left Eye Near:    Bilateral Near:     Physical Exam Vitals and nursing note reviewed.  Constitutional:      Appearance: He is not ill-appearing.  HENT:     Head: Normocephalic.     Comments: Patient is an abrasion to his left temple. Eyes:     General: No scleral icterus.    Extraocular Movements: Extraocular movements intact.     Conjunctiva/sclera: Conjunctivae normal.     Pupils: Pupils are equal, round, and reactive to light.  Musculoskeletal:        General:  Tenderness present.  Skin:    General: Skin is warm and dry.     Capillary Refill: Capillary refill takes less than 2 seconds.     Findings: Bruising present.  Neurological:     Mental Status: He is  alert and oriented to person, place, and time.     Cranial Nerves: No cranial nerve deficit.     Sensory: No sensory deficit.     Motor: Weakness present.     Coordination: Coordination abnormal.     Gait: Gait abnormal.     Deep Tendon Reflexes: Reflexes normal.      UC Treatments / Results  Labs (all labs ordered are listed, but only abnormal results are displayed) Labs Reviewed - No data to display  EKG   Radiology No results found.  Procedures Procedures (including critical care time)  Medications Ordered in UC Medications - No data to display  Initial Impression / Assessment and Plan / UC Course  I have reviewed the triage vital signs and the nursing notes.  Pertinent labs & imaging results that were available during my care of the patient were reviewed by me and considered in my medical decision making (see chart for details).   Patient is a pleasant 62 year old male with significant past medical history as reviewed in HPI above presenting for a closed head injury that happened approximately 1 hour prior to arrival.  He states that he has been working a lot on [employer] that he shows that his legs have been sore.  He states that this soreness caused his legs to give out this morning which in turn caused him to fall and strike his left temple on a porcelain sink.  He denies loss of consciousness, change in vision, nausea or vomiting.  Cranial nerves II through XII are intact on exam however patient does have weakness in his left arm and leg.  He also has difficulty with coordination doing finger-nose with his left arm.  Given that patient has history of open craniotomy and on high-dose aspirin therapy, coupled with his unsteady gait and left-sided weakness and coordination  deficits I feel he should be evaluated in the emergency department.  He is refusing EMS transport and will travel via POV to [facility].   Final Clinical Impressions(s) / UC Diagnoses   Final diagnoses:  Injury of head, initial encounter  Unsteady gait when walking  Acute left-sided weakness     Discharge Instructions      Please go to the emergency department [facility] for evaluation of your head injury coupled with your unsteady gait and your left-sided weakness.  Please go now.     ED Prescriptions   None    PDMP not reviewed this encounter.   Margarette Canada, NP 09/11/22 [phone]

## 2022-09-11 NOTE — Discharge Instructions (Signed)
Your CT scans were normal.  Please return the emergency department if you develop worsening headache, vomiting, trouble walking, vision changes, numbness, tingling, weakness, or any other concerns. It was a pleasure caring for you today.

## 2022-09-11 NOTE — ED Provider Notes (Signed)
[facility] Provider Note    Event Date/Time   First MD Initiated Contact with Patient 09/11/22 [phone]     (approximate)   History   Fall   HPI  Randy Luna is a 62 y.o. male with a past medical history of coronary artery disease status post bypass x 4, on full dose aspirin daily, craniotomy in 1999 he presents today for evaluation of fall with head strike.  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  He reports that he lost his balance this morning and struck the left side of his head on the bathroom counter.  No vision changes.  No nausea or vomiting.  He denies any neck pain.  No paresthesias or weakness in his extremities.  Patient Active Problem List   Diagnosis Date Noted   Goals of care, counseling/discussion 10/22/2017   Thrombocytosis 10/15/2017   Anemia 10/15/2017   B12 deficiency 10/10/2017   S/P coronary artery bypass graft x 4 09/26/2017   Coronary artery disease involving native coronary artery of native heart with angina pectoris ([place]) 09/24/2017   NSTEMI (non-ST elevated myocardial infarction) ([place]) 09/23/2017   Chest pain 09/21/2017   Acute renal failure (ARF) ([place]) 12/02/2016   Chronic gouty arthropathy without tophi 11/25/2016   Type 2 diabetes mellitus, with long-term current use of insulin ([place]) 10/31/2016   Essential hypertension 09/02/2014   Hyperlipidemia, mixed 09/02/2014          Physical Exam   Triage Vital Signs: ED Triage Vitals [09/11/22 0856]  Enc Vitals Group     BP (!) 166/103     Pulse Rate 66     Resp 18     Temp 98.4 F (36.9 C)     Temp Source Oral     SpO2 100 %     Weight 225 lb (102.1 kg)     Height '5\' 6"'$  (1.676 m)     Head Circumference      Peak Flow      Pain Score 2     Pain Loc      Pain Edu?      Excl. in [place]?     Most recent vital signs: Vitals:   09/11/22 0856  BP: (!) 166/103  Pulse: 66  Resp: 18  Temp: 98.4 F (36.9 C)   SpO2: 100%    Physical Exam Vitals and nursing note reviewed.  Constitutional:      General: Awake and alert. No acute distress.    Appearance: Normal appearance. The patient is normal weight.  HENT:     Head: Normocephalic. Abrasion to the left side of the face, no bleeding or ecchymosis. No battle sign or raccoon eyes    Mouth: Mucous membranes are moist.  Eyes:     General: PERRL. Normal EOMs        Right eye: No discharge.        Left eye: No discharge.     Conjunctiva/sclera: Conjunctivae normal.  Cardiovascular:     Rate and Rhythm: Normal rate and regular rhythm.     Pulses: Normal pulses.  Pulmonary:     Effort: Pulmonary effort is normal. No respiratory distress.     Breath sounds: Normal breath sounds.  Abdominal:     Abdomen is soft. There is no abdominal tenderness. No rebound or guarding. No distention. Musculoskeletal:        General: No swelling. Normal range of motion.  Cervical back: Normal range of motion and neck supple.  No midline cervical spine tenderness.  Full range of motion of neck.  Negative Spurling test.  Negative Lhermitte sign.  Normal strength and sensation in bilateral upper extremities. Normal grip strength bilaterally.  Normal intrinsic muscle function of the hand bilaterally.  Normal radial pulses bilaterally. Skin:    General: Skin is warm and dry.     Capillary Refill: Capillary refill takes less than 2 seconds.     Findings: No rash.  Neurological:     Mental Status: The patient is awake and alert.  Neurological: GCS 15 alert and oriented x3 Normal speech, no expressive or receptive aphasia or dysarthria Cranial nerves II through XII intact Normal visual fields 5 out of 5 strength in all 4 extremities with intact sensation throughout No extremity drift Normal finger-to-nose testing, no limb or truncal ataxia     ED Results / Procedures / Treatments   Labs (all labs ordered are listed, but only abnormal results are  displayed) Labs Reviewed - No data to display   EKG     RADIOLOGY     PROCEDURES:  Critical Care performed:   Procedures   MEDICATIONS ORDERED IN ED: Medications - No data to display    IMPRESSION / MDM / [place] / ED COURSE  I reviewed the triage vital signs and the nursing notes.   Differential diagnosis includes, but is not limited to, intracranial hemorrhage, skull fracture, concussion, contusion, abrasion, cervical spine injury.  I reviewed the patient's chart.  Patient was seen in the urgent care earlier today who sent him to the emergency department for advanced imaging.  Per the urgent care providers note, patient had gait instability and left-sided weakness.  This is not appreciated on my exam today.  He is able to ambulate down the emergency department hallway unassisted and with a steady gait.  He has absolutely no weakness on my exam.  Patient is awake and alert, hemodynamically stable and neurologically intact.  No focal neurological deficits.  Full strength and sensation in all 4 extremities, ambulatory with a steady gait.  No headache currently.  No vomiting or vision changes.  CT scans obtained given his history of craniotomy and full dose daily aspirin.  CT scans were normal.  Patient is reassured by these findings.  He declined analgesia.  We discussed symptomatic management and strict return precautions.  Patient understands and agrees with plan.  He was discharged in stable condition with his family member.  All questions were answered.   Patient's presentation is most consistent with acute complicated illness / injury requiring diagnostic workup.  Clinical Course as of 09/11/22 0955  Wed Sep 11, 2022  [id number] Patient visualized to ambulate down the ER hallway unassisted and with a steady gait [JP]    Clinical Course User Index [JP] Rozann Holts, Clarnce Flock, PA-C     FINAL CLINICAL IMPRESSION(S) / ED DIAGNOSES   Final diagnoses:  Injury of head,  initial encounter  Fall, initial encounter     Rx / DC Orders   ED Discharge Orders     None        Note:  This document was prepared using Dragon voice recognition software and may include unintentional dictation errors.   Emeline Gins 09/11/22 [id number]    Vanessa Easton, MD 09/12/22 [phone]

## 2022-09-11 NOTE — ED Notes (Signed)
Patient is being discharged from the Urgent Care and sent to the Emergency Department via personal vehicle . Per Charmayne Sheer NP, patient is in need of higher level of care due to further evaluation of an head injury. Patient is aware and verbalizes understanding of plan of care.  Vitals:   09/11/22 0815  BP: 138/71  Pulse: 64  Resp: 16  Temp: 97.9 F (36.6 C)  SpO2: 95%

## 2023-03-09 ENCOUNTER — Encounter: Payer: Self-pay | Admitting: Emergency Medicine

## 2023-03-09 ENCOUNTER — Emergency Department: Payer: Managed Care, Other (non HMO)

## 2023-03-09 ENCOUNTER — Other Ambulatory Visit: Payer: Self-pay

## 2023-03-09 ENCOUNTER — Emergency Department
Admission: EM | Admit: 2023-03-09 | Discharge: 2023-03-09 | Disposition: A | Discharge status: Home / Self Care | Payer: Managed Care, Other (non HMO) | Attending: Emergency Medicine | Admitting: Emergency Medicine

## 2023-03-09 DIAGNOSIS — E119 Type 2 diabetes mellitus without complications: Secondary | ICD-10-CM

## 2023-03-09 DIAGNOSIS — Z951 Presence of aortocoronary bypass graft: Secondary | ICD-10-CM | POA: Diagnosis not present

## 2023-03-09 DIAGNOSIS — Z794 Long term (current) use of insulin: Secondary | ICD-10-CM | POA: Insufficient documentation

## 2023-03-09 DIAGNOSIS — R202 Paresthesia of skin: Secondary | ICD-10-CM | POA: Insufficient documentation

## 2023-03-09 DIAGNOSIS — Z7982 Long term (current) use of aspirin: Secondary | ICD-10-CM | POA: Diagnosis not present

## 2023-03-09 DIAGNOSIS — I10 Essential (primary) hypertension: Secondary | ICD-10-CM | POA: Insufficient documentation

## 2023-03-09 LAB — CBG MONITORING, ED: Glucose-Capillary: 181 mg/dL — ABNORMAL HIGH (ref 70–99)

## 2023-03-09 LAB — ETHANOL: Alcohol, Ethyl (B): <10 mg/dL (ref <10)

## 2023-03-09 LAB — DIFFERENTIAL
Abs Immature Granulocytes: 0.03 10*3/uL (ref 0.00–0.07)
Basophils Absolute: 0.1 10*3/uL (ref 0.0–0.1)
Basophils Relative: 1 %
Eosinophils Absolute: 0.2 10*3/uL (ref 0.0–0.5)
Eosinophils Relative: 3 %
Immature Granulocytes: 0 %
Lymphocytes Relative: 25 %
Lymphs Abs: 1.8 10*3/uL (ref 0.7–4.0)
Monocytes Absolute: 0.8 10*3/uL (ref 0.1–1.0)
Monocytes Relative: 10 %
Neutro Abs: 4.5 10*3/uL (ref 1.7–7.7)
Neutrophils Relative %: 61 %

## 2023-03-09 LAB — CBC
HCT: 39.7 % (ref 39.0–52.0)
Hemoglobin: 13.2 g/dL (ref 13.0–17.0)
MCH: 28.9 pg (ref 26.0–34.0)
MCHC: 33.2 g/dL (ref 30.0–36.0)
MCV: 87.1 fL (ref 80.0–100.0)
Platelets: 318 10*3/uL (ref 150–400)
RBC: 4.56 MIL/uL (ref 4.22–5.81)
RDW: 13.8 % (ref 11.5–15.5)
WBC: 7.4 10*3/uL (ref 4.0–10.5)
nRBC: 0 % (ref 0.0–0.2)

## 2023-03-09 LAB — COMPREHENSIVE METABOLIC PANEL
ALT: 23 U/L (ref 0–44)
AST: 21 U/L (ref 15–41)
Albumin: 3.8 g/dL (ref 3.5–5.0)
Alkaline Phosphatase: 38 U/L (ref 38–126)
Anion gap: 7 (ref 5–15)
BUN: 19 mg/dL (ref 8–23)
CO2: 21 mmol/L — ABNORMAL LOW (ref 22–32)
Calcium: 8.7 mg/dL — ABNORMAL LOW (ref 8.9–10.3)
Chloride: 106 mmol/L (ref 98–111)
Creatinine, Ser: 1.03 mg/dL (ref 0.61–1.24)
GFR, Estimated: >60 mL/min (ref >60)
Glucose, Bld: 202 mg/dL — ABNORMAL HIGH (ref 70–99)
Potassium: 4.5 mmol/L (ref 3.5–5.1)
Sodium: 134 mmol/L — ABNORMAL LOW (ref 135–145)
Total Bilirubin: 0.5 mg/dL (ref 0.3–1.2)
Total Protein: 6.5 g/dL (ref 6.5–8.1)

## 2023-03-09 LAB — APTT: aPTT: 30 seconds (ref 24–36)

## 2023-03-09 LAB — PROTIME-INR
INR: 1 (ref 0.8–1.2)
Prothrombin Time: 12.9 seconds (ref 11.4–15.2)

## 2023-03-09 LAB — TROPONIN I (HIGH SENSITIVITY): Troponin I (High Sensitivity): 9 ng/L (ref <18)

## 2023-03-09 MED ORDER — SODIUM CHLORIDE 0.9% FLUSH: 3.0000 mL | Freq: Once | INTRAVENOUS | Status: DC

## 2023-03-09 MED ORDER — TRAMADOL HCL 50 MG PO TABS: 50.0000 mg | ORAL_TABLET | Freq: Four times a day (QID) | ORAL | 0 refills | Status: AC | PRN

## 2023-03-09 MED ORDER — TRAMADOL HCL 50 MG PO TABS
50.0000 mg | ORAL_TABLET | Freq: Once | ORAL | Status: AC
  Administered 2023-03-09: 50 mg via ORAL
  Filled 2023-03-09: qty 1

## 2023-03-09 NOTE — ED Triage Notes (Signed)
First nurse note: Pt ambulatory to triage. Pt reports left facial and left arm tingling/numbness that has been intermittent x5 days

## 2023-03-09 NOTE — ED Notes (Signed)
Pt placed on monitor. Primary

## 2023-03-09 NOTE — ED Triage Notes (Signed)
Patient to ED via POV for left arm/ facial tingling- intermittent x5 days. Denies weakness, speaking in full sentences. Hx of bypass, diabetes, hypertension.

## 2023-03-09 NOTE — Discharge Instructions (Signed)
Your CT scan of the head and lab tests were all okay today.  Continue taking aspirin daily and tylenol as needed, and follow up with cardiology for further evaluation.

## 2023-03-09 NOTE — ED Provider Notes (Addendum)
[facility] Provider Note    Event Date/Time   First MD Initiated Contact with Patient 03/09/23 1019     (approximate)   History   Chief Complaint: Tingling   HPI  Randy Luna is a 62 y.o. male with a past history of hypertension, diabetes, NSTEMI, CABG who comes ED complaining of left arm and left side of the face tingling for the past 5 days, off and on.  No aggravating or alleviating factors.  No motor weakness, no headache vision changes or change in balance and coordination.  No exertional symptoms.  Denies chest pain or shortness of breath.  Reports that the symptoms are similar to symptoms he had in the past prior to his CABG.  He walks regularly.  Yesterday he walked 4 miles without any adverse symptoms.  He is compliant with daily aspirin.     Physical Exam   Triage Vital Signs: ED Triage Vitals  Encounter Vitals Group     BP 03/09/23 0954 (!) 190/109     Systolic BP Percentile --      Diastolic BP Percentile --      Pulse Rate 03/09/23 0954 76     Resp 03/09/23 0954 18     Temp 03/09/23 0954 98.9 F (37.2 C)     Temp Source 03/09/23 0954 Oral     SpO2 03/09/23 0954 97 %     Weight 03/09/23 0953 240 lb (108.9 kg)     Height 03/09/23 0953 5\' 6"  (1.676 m)     Head Circumference --      Peak Flow --      Pain Score 03/09/23 0953 0     Pain Loc --      Pain Education --      Exclude from Growth Chart --     Most recent vital signs: Vitals:   03/09/23 0954 03/09/23 1044  BP: (!) 190/109 (!) 158/84  Pulse: 76 67  Resp: 18 14  Temp: 98.9 F (37.2 C)   SpO2: 97% 99%    General: Awake, no distress.  CV:  Good peripheral perfusion.  Regular rate and rhythm Resp:  Normal effort.  Clear to auscultation bilaterally Abd:  No distention.  Soft nontender Other:  No lower extremity edema, symmetric calf circumference.   ED Results / Procedures / Treatments   Labs (all labs ordered are listed, but only abnormal results are  displayed) Labs Reviewed  COMPREHENSIVE METABOLIC PANEL - Abnormal; Notable for the following components:      Result Value   Sodium 134 (*)    CO2 21 (*)    Glucose, Bld 202 (*)    Calcium 8.7 (*)    All other components within normal limits  CBG MONITORING, ED - Abnormal; Notable for the following components:   Glucose-Capillary 181 (*)    All other components within normal limits  PROTIME-INR  APTT  CBC  DIFFERENTIAL  ETHANOL  I-STAT CREATININE, ED  TROPONIN I (HIGH SENSITIVITY)     EKG Interpreted by me Normal sinus rhythm rate of 74.  Normal axis, normal intervals.  Poor R wave progression.  Normal ST segments and T waves.   RADIOLOGY CT head interpreted by me, no intracranial hemorrhage, mass, or recent infarct.  Radiology report reviewed   PROCEDURES:  Procedures   MEDICATIONS ORDERED IN ED: Medications  sodium chloride flush (NS) 0.9 % injection 3 mL (3 mLs Intravenous Not Given 03/09/23 1034)     IMPRESSION /  MDM / ASSESSMENT AND PLAN / ED COURSE  I reviewed the triage vital signs and the nursing notes.  DDx: Subacute stroke, intracranial hemorrhage, intracranial mass, NSTEMI, electrolyte abnormality, anemia  Patient's presentation is most consistent with acute presentation with potential threat to life or bodily function.  Patient presents with paresthesias felt on the left side of the face and the left arm.  Sensation the rest of the neurologic exam is intact.  EKG, labs, CT head all unremarkable.  He has not seen cardiology in several years after the retirement of his previous cardiologist.  Will refer him back to his cardiology clinic for further evaluation.  Suspect radiculopathy, which has not responded to NSAIDs at home.  Will provide a short course of tramadol       FINAL CLINICAL IMPRESSION(S) / ED DIAGNOSES   Final diagnoses:  Paresthesia of arm  Type 2 diabetes mellitus without complication, with long-term current use of insulin (HCC)      Rx / DC Orders   ED Discharge Orders          Ordered    Ambulatory referral to Cardiology       Comments: If you have not heard from the Cardiology office within the next 72 hours please call [phone].   03/09/23 1140             Note:  This document was prepared using Dragon voice recognition software and may include unintentional dictation errors.   Sharman Cheek, MD 03/09/23 1144    Sharman Cheek, MD 03/09/23 [phone]
